# Patient Record
Sex: Female | Born: 1935
Health system: Southern US, Community
[De-identification: ages and names within clinical notes are randomized; demographics above are authoritative.]

## PROBLEM LIST (undated history)

## (undated) DIAGNOSIS — E079 Disorder of thyroid, unspecified: Secondary | ICD-10-CM

## (undated) DIAGNOSIS — D649 Anemia, unspecified: Secondary | ICD-10-CM

## (undated) DIAGNOSIS — I10 Essential (primary) hypertension: Secondary | ICD-10-CM

## (undated) HISTORY — PX: BACK SURGERY: SHX140

## (undated) HISTORY — PX: CHOLECYSTECTOMY: SHX55

---

## 2002-03-27 ENCOUNTER — Encounter: Payer: Self-pay | Admitting: Family Medicine

## 2002-03-27 ENCOUNTER — Ambulatory Visit (HOSPITAL_COMMUNITY): Admission: RE | Admit: 2002-03-27 | Discharge: 2002-03-27 | Payer: Self-pay | Admitting: Family Medicine

## 2008-08-28 ENCOUNTER — Ambulatory Visit (HOSPITAL_COMMUNITY): Admission: RE | Admit: 2008-08-28 | Discharge: 2008-08-28 | Payer: Self-pay | Admitting: Family Medicine

## 2009-12-26 ENCOUNTER — Ambulatory Visit (HOSPITAL_COMMUNITY): Admission: RE | Admit: 2009-12-26 | Discharge: 2009-12-26 | Payer: Self-pay | Admitting: Nephrology

## 2012-01-19 DIAGNOSIS — E119 Type 2 diabetes mellitus without complications: Secondary | ICD-10-CM | POA: Diagnosis not present

## 2012-01-19 DIAGNOSIS — H26019 Infantile and juvenile cortical, lamellar, or zonular cataract, unspecified eye: Secondary | ICD-10-CM | POA: Diagnosis not present

## 2012-01-19 DIAGNOSIS — H2510 Age-related nuclear cataract, unspecified eye: Secondary | ICD-10-CM | POA: Diagnosis not present

## 2012-02-08 DIAGNOSIS — D649 Anemia, unspecified: Secondary | ICD-10-CM | POA: Diagnosis not present

## 2012-02-08 DIAGNOSIS — E559 Vitamin D deficiency, unspecified: Secondary | ICD-10-CM | POA: Diagnosis not present

## 2012-02-08 DIAGNOSIS — R809 Proteinuria, unspecified: Secondary | ICD-10-CM | POA: Diagnosis not present

## 2012-02-08 DIAGNOSIS — M109 Gout, unspecified: Secondary | ICD-10-CM | POA: Diagnosis not present

## 2012-02-08 DIAGNOSIS — N189 Chronic kidney disease, unspecified: Secondary | ICD-10-CM | POA: Diagnosis not present

## 2012-02-08 DIAGNOSIS — Z79899 Other long term (current) drug therapy: Secondary | ICD-10-CM | POA: Diagnosis not present

## 2012-02-08 DIAGNOSIS — I10 Essential (primary) hypertension: Secondary | ICD-10-CM | POA: Diagnosis not present

## 2012-02-10 DIAGNOSIS — I10 Essential (primary) hypertension: Secondary | ICD-10-CM | POA: Diagnosis not present

## 2012-02-10 DIAGNOSIS — E559 Vitamin D deficiency, unspecified: Secondary | ICD-10-CM | POA: Diagnosis not present

## 2012-02-10 DIAGNOSIS — D649 Anemia, unspecified: Secondary | ICD-10-CM | POA: Diagnosis not present

## 2012-04-20 ENCOUNTER — Other Ambulatory Visit (HOSPITAL_COMMUNITY): Payer: Self-pay | Admitting: Family Medicine

## 2012-04-20 DIAGNOSIS — I10 Essential (primary) hypertension: Secondary | ICD-10-CM | POA: Diagnosis not present

## 2012-04-20 DIAGNOSIS — Z01419 Encounter for gynecological examination (general) (routine) without abnormal findings: Secondary | ICD-10-CM | POA: Diagnosis not present

## 2012-04-20 DIAGNOSIS — Z139 Encounter for screening, unspecified: Secondary | ICD-10-CM

## 2012-04-20 DIAGNOSIS — Z6825 Body mass index (BMI) 25.0-25.9, adult: Secondary | ICD-10-CM | POA: Diagnosis not present

## 2012-04-21 DIAGNOSIS — I10 Essential (primary) hypertension: Secondary | ICD-10-CM | POA: Diagnosis not present

## 2012-04-28 ENCOUNTER — Ambulatory Visit (HOSPITAL_COMMUNITY)
Admission: RE | Admit: 2012-04-28 | Discharge: 2012-04-28 | Disposition: A | Discharge status: Home / Self Care | Payer: Medicare Other | Source: Ambulatory Visit | Attending: Family Medicine | Admitting: Family Medicine

## 2012-04-28 DIAGNOSIS — Z13820 Encounter for screening for osteoporosis: Secondary | ICD-10-CM | POA: Insufficient documentation

## 2012-04-28 DIAGNOSIS — Z1231 Encounter for screening mammogram for malignant neoplasm of breast: Secondary | ICD-10-CM | POA: Diagnosis not present

## 2012-04-28 DIAGNOSIS — M949 Disorder of cartilage, unspecified: Secondary | ICD-10-CM | POA: Diagnosis not present

## 2012-04-28 DIAGNOSIS — Z139 Encounter for screening, unspecified: Secondary | ICD-10-CM

## 2012-04-28 DIAGNOSIS — M899 Disorder of bone, unspecified: Secondary | ICD-10-CM | POA: Diagnosis not present

## 2012-04-28 DIAGNOSIS — Z780 Asymptomatic menopausal state: Secondary | ICD-10-CM | POA: Insufficient documentation

## 2012-04-29 ENCOUNTER — Encounter (INDEPENDENT_AMBULATORY_CARE_PROVIDER_SITE_OTHER): Payer: Self-pay | Admitting: *Deleted

## 2012-08-04 DIAGNOSIS — E559 Vitamin D deficiency, unspecified: Secondary | ICD-10-CM | POA: Diagnosis not present

## 2012-08-04 DIAGNOSIS — D649 Anemia, unspecified: Secondary | ICD-10-CM | POA: Diagnosis not present

## 2012-08-04 DIAGNOSIS — Z79899 Other long term (current) drug therapy: Secondary | ICD-10-CM | POA: Diagnosis not present

## 2012-08-04 DIAGNOSIS — I10 Essential (primary) hypertension: Secondary | ICD-10-CM | POA: Diagnosis not present

## 2012-08-11 DIAGNOSIS — IMO0002 Reserved for concepts with insufficient information to code with codable children: Secondary | ICD-10-CM | POA: Diagnosis not present

## 2012-08-11 DIAGNOSIS — I10 Essential (primary) hypertension: Secondary | ICD-10-CM | POA: Diagnosis not present

## 2012-08-11 DIAGNOSIS — E785 Hyperlipidemia, unspecified: Secondary | ICD-10-CM | POA: Diagnosis not present

## 2012-08-11 DIAGNOSIS — E1129 Type 2 diabetes mellitus with other diabetic kidney complication: Secondary | ICD-10-CM | POA: Diagnosis not present

## 2012-08-30 DIAGNOSIS — D649 Anemia, unspecified: Secondary | ICD-10-CM | POA: Diagnosis not present

## 2012-08-30 DIAGNOSIS — I10 Essential (primary) hypertension: Secondary | ICD-10-CM | POA: Diagnosis not present

## 2013-01-24 DIAGNOSIS — E119 Type 2 diabetes mellitus without complications: Secondary | ICD-10-CM | POA: Diagnosis not present

## 2013-01-24 DIAGNOSIS — H2510 Age-related nuclear cataract, unspecified eye: Secondary | ICD-10-CM | POA: Diagnosis not present

## 2013-03-30 DIAGNOSIS — I10 Essential (primary) hypertension: Secondary | ICD-10-CM | POA: Diagnosis not present

## 2013-03-30 DIAGNOSIS — IMO0002 Reserved for concepts with insufficient information to code with codable children: Secondary | ICD-10-CM | POA: Diagnosis not present

## 2013-03-30 DIAGNOSIS — E1129 Type 2 diabetes mellitus with other diabetic kidney complication: Secondary | ICD-10-CM | POA: Diagnosis not present

## 2013-03-30 DIAGNOSIS — E785 Hyperlipidemia, unspecified: Secondary | ICD-10-CM | POA: Diagnosis not present

## 2013-04-13 DIAGNOSIS — Z79899 Other long term (current) drug therapy: Secondary | ICD-10-CM | POA: Diagnosis not present

## 2013-04-13 DIAGNOSIS — N189 Chronic kidney disease, unspecified: Secondary | ICD-10-CM | POA: Diagnosis not present

## 2013-04-13 DIAGNOSIS — E559 Vitamin D deficiency, unspecified: Secondary | ICD-10-CM | POA: Diagnosis not present

## 2013-04-13 DIAGNOSIS — D649 Anemia, unspecified: Secondary | ICD-10-CM | POA: Diagnosis not present

## 2013-04-17 DIAGNOSIS — D649 Anemia, unspecified: Secondary | ICD-10-CM | POA: Diagnosis not present

## 2013-04-17 DIAGNOSIS — M109 Gout, unspecified: Secondary | ICD-10-CM | POA: Diagnosis not present

## 2013-04-17 DIAGNOSIS — E559 Vitamin D deficiency, unspecified: Secondary | ICD-10-CM | POA: Diagnosis not present

## 2013-08-23 DIAGNOSIS — D649 Anemia, unspecified: Secondary | ICD-10-CM | POA: Diagnosis not present

## 2013-08-23 DIAGNOSIS — R809 Proteinuria, unspecified: Secondary | ICD-10-CM | POA: Diagnosis not present

## 2013-08-23 DIAGNOSIS — Z79899 Other long term (current) drug therapy: Secondary | ICD-10-CM | POA: Diagnosis not present

## 2013-08-23 DIAGNOSIS — N189 Chronic kidney disease, unspecified: Secondary | ICD-10-CM | POA: Diagnosis not present

## 2013-08-29 DIAGNOSIS — M109 Gout, unspecified: Secondary | ICD-10-CM | POA: Diagnosis not present

## 2013-08-29 DIAGNOSIS — I10 Essential (primary) hypertension: Secondary | ICD-10-CM | POA: Diagnosis not present

## 2013-08-29 DIAGNOSIS — E559 Vitamin D deficiency, unspecified: Secondary | ICD-10-CM | POA: Diagnosis not present

## 2013-09-29 DIAGNOSIS — IMO0002 Reserved for concepts with insufficient information to code with codable children: Secondary | ICD-10-CM | POA: Diagnosis not present

## 2013-09-29 DIAGNOSIS — I10 Essential (primary) hypertension: Secondary | ICD-10-CM | POA: Diagnosis not present

## 2013-09-29 DIAGNOSIS — E039 Hypothyroidism, unspecified: Secondary | ICD-10-CM | POA: Diagnosis not present

## 2014-01-11 DIAGNOSIS — R809 Proteinuria, unspecified: Secondary | ICD-10-CM | POA: Diagnosis not present

## 2014-01-11 DIAGNOSIS — N189 Chronic kidney disease, unspecified: Secondary | ICD-10-CM | POA: Diagnosis not present

## 2014-01-11 DIAGNOSIS — Z79899 Other long term (current) drug therapy: Secondary | ICD-10-CM | POA: Diagnosis not present

## 2014-01-11 DIAGNOSIS — D649 Anemia, unspecified: Secondary | ICD-10-CM | POA: Diagnosis not present

## 2014-01-11 DIAGNOSIS — E559 Vitamin D deficiency, unspecified: Secondary | ICD-10-CM | POA: Diagnosis not present

## 2014-01-16 DIAGNOSIS — I10 Essential (primary) hypertension: Secondary | ICD-10-CM | POA: Diagnosis not present

## 2014-01-16 DIAGNOSIS — D649 Anemia, unspecified: Secondary | ICD-10-CM | POA: Diagnosis not present

## 2014-01-16 DIAGNOSIS — E559 Vitamin D deficiency, unspecified: Secondary | ICD-10-CM | POA: Diagnosis not present

## 2014-01-16 DIAGNOSIS — N183 Chronic kidney disease, stage 3 unspecified: Secondary | ICD-10-CM | POA: Diagnosis not present

## 2014-02-27 DIAGNOSIS — H2510 Age-related nuclear cataract, unspecified eye: Secondary | ICD-10-CM | POA: Diagnosis not present

## 2014-02-27 DIAGNOSIS — E119 Type 2 diabetes mellitus without complications: Secondary | ICD-10-CM | POA: Diagnosis not present

## 2014-02-27 DIAGNOSIS — H26019 Infantile and juvenile cortical, lamellar, or zonular cataract, unspecified eye: Secondary | ICD-10-CM | POA: Diagnosis not present

## 2014-04-23 DIAGNOSIS — N189 Chronic kidney disease, unspecified: Secondary | ICD-10-CM | POA: Diagnosis not present

## 2014-04-23 DIAGNOSIS — R809 Proteinuria, unspecified: Secondary | ICD-10-CM | POA: Diagnosis not present

## 2014-04-23 DIAGNOSIS — E559 Vitamin D deficiency, unspecified: Secondary | ICD-10-CM | POA: Diagnosis not present

## 2014-04-23 DIAGNOSIS — Z79899 Other long term (current) drug therapy: Secondary | ICD-10-CM | POA: Diagnosis not present

## 2014-04-23 DIAGNOSIS — D649 Anemia, unspecified: Secondary | ICD-10-CM | POA: Diagnosis not present

## 2014-04-25 DIAGNOSIS — N183 Chronic kidney disease, stage 3 unspecified: Secondary | ICD-10-CM | POA: Diagnosis not present

## 2014-04-25 DIAGNOSIS — D649 Anemia, unspecified: Secondary | ICD-10-CM | POA: Diagnosis not present

## 2014-04-25 DIAGNOSIS — E559 Vitamin D deficiency, unspecified: Secondary | ICD-10-CM | POA: Diagnosis not present

## 2014-04-25 DIAGNOSIS — I10 Essential (primary) hypertension: Secondary | ICD-10-CM | POA: Diagnosis not present

## 2014-04-29 ENCOUNTER — Emergency Department (HOSPITAL_COMMUNITY): Payer: Medicare Other

## 2014-04-29 ENCOUNTER — Encounter (HOSPITAL_COMMUNITY): Payer: Self-pay | Admitting: Emergency Medicine

## 2014-04-29 ENCOUNTER — Emergency Department (HOSPITAL_COMMUNITY)
Admission: EM | Admit: 2014-04-29 | Discharge: 2014-04-29 | Disposition: A | Discharge status: Home / Self Care | Payer: Medicare Other | Attending: Emergency Medicine | Admitting: Emergency Medicine

## 2014-04-29 DIAGNOSIS — Y9389 Activity, other specified: Secondary | ICD-10-CM | POA: Insufficient documentation

## 2014-04-29 DIAGNOSIS — M5137 Other intervertebral disc degeneration, lumbosacral region: Secondary | ICD-10-CM | POA: Diagnosis not present

## 2014-04-29 DIAGNOSIS — IMO0002 Reserved for concepts with insufficient information to code with codable children: Secondary | ICD-10-CM | POA: Diagnosis not present

## 2014-04-29 DIAGNOSIS — K5900 Constipation, unspecified: Secondary | ICD-10-CM | POA: Insufficient documentation

## 2014-04-29 DIAGNOSIS — M549 Dorsalgia, unspecified: Secondary | ICD-10-CM | POA: Diagnosis not present

## 2014-04-29 DIAGNOSIS — M47817 Spondylosis without myelopathy or radiculopathy, lumbosacral region: Secondary | ICD-10-CM | POA: Diagnosis not present

## 2014-04-29 DIAGNOSIS — I10 Essential (primary) hypertension: Secondary | ICD-10-CM | POA: Diagnosis not present

## 2014-04-29 DIAGNOSIS — Z8639 Personal history of other endocrine, nutritional and metabolic disease: Secondary | ICD-10-CM | POA: Insufficient documentation

## 2014-04-29 DIAGNOSIS — X500XXA Overexertion from strenuous movement or load, initial encounter: Secondary | ICD-10-CM | POA: Insufficient documentation

## 2014-04-29 DIAGNOSIS — Y9289 Other specified places as the place of occurrence of the external cause: Secondary | ICD-10-CM | POA: Insufficient documentation

## 2014-04-29 DIAGNOSIS — Z862 Personal history of diseases of the blood and blood-forming organs and certain disorders involving the immune mechanism: Secondary | ICD-10-CM | POA: Diagnosis not present

## 2014-04-29 HISTORY — DX: Anemia, unspecified: D64.9

## 2014-04-29 HISTORY — DX: Essential (primary) hypertension: I10

## 2014-04-29 HISTORY — DX: Disorder of thyroid, unspecified: E07.9

## 2014-04-29 LAB — URINALYSIS, ROUTINE W REFLEX MICROSCOPIC
Bilirubin Urine: NEGATIVE
Glucose, UA: NEGATIVE mg/dL
HGB URINE DIPSTICK: NEGATIVE
KETONES UR: NEGATIVE mg/dL
Leukocytes, UA: NEGATIVE
Nitrite: NEGATIVE
PROTEIN: NEGATIVE mg/dL
Specific Gravity, Urine: 1.02 (ref 1.005–1.030)
UROBILINOGEN UA: 1 mg/dL (ref 0.0–1.0)
pH: 6 (ref 5.0–8.0)

## 2014-04-29 MED ORDER — IBUPROFEN 400 MG PO TABS: 400.0000 mg | ORAL_TABLET | Freq: Three times a day (TID) | ORAL | Status: AC

## 2014-04-29 MED ORDER — ACETAMINOPHEN 325 MG PO TABS
650.0000 mg | ORAL_TABLET | Freq: Once | ORAL | Status: AC
  Administered 2014-04-29: 650 mg via ORAL
  Filled 2014-04-29: qty 2

## 2014-04-29 MED ORDER — LUMBAR BACK BRACE/SUPPORT PAD MISC: 1.0000 | Freq: Once | Status: DC

## 2014-04-29 NOTE — Discharge Instructions (Signed)
As discussed, your evaluation today has been largely reassuring.  But, it is important that you monitor your condition carefully, and do not hesitate to return to the ED if you develop new, or concerning changes in your condition.  Your pain is likely to due strain of the muscles in your lower back.  Please follow-up with your physician for appropriate ongoing care.   Back Pain, Adult Back pain is very common. The pain often gets better over time. The cause of back pain is usually not dangerous. Most people can learn to manage their back pain on their own.  HOME CARE   Stay active. Start with short walks on flat ground if you can. Try to walk farther each day.  Do not sit, drive, or stand in one place for more than 30 minutes. Do not stay in bed.  Do not avoid exercise or work. Activity can help your back heal faster.  Be careful when you bend or lift an object. Bend at your knees, keep the object close to you, and do not twist.  Sleep on a firm mattress. Lie on your side, and bend your knees. If you lie on your back, put a pillow under your knees.  Only take medicines as told by your doctor.  Put ice on the injured area.  Put ice in a plastic bag.  Place a towel between your skin and the bag.  Leave the ice on for 15-20 minutes, 03-04 times a day for the first 2 to 3 days. After that, you can switch between ice and heat packs.  Ask your doctor about back exercises or massage.  Avoid feeling anxious or stressed. Find good ways to deal with stress, such as exercise. GET HELP RIGHT AWAY IF:   Your pain does not go away with rest or medicine.  Your pain does not go away in 1 week.  You have new problems.  You do not feel well.  The pain spreads into your legs.  You cannot control when you poop (bowel movement) or pee (urinate).  Your arms or legs feel weak or lose feeling (numbness).  You feel sick to your stomach (nauseous) or throw up (vomit).  You have belly  (abdominal) pain.  You feel like you may pass out (faint). MAKE SURE YOU:   Understand these instructions.  Will watch your condition.  Will get help right away if you are not doing well or get worse. Document Released: 05/25/2008 Document Revised: 02/29/2012 Document Reviewed: 04/27/2011 [facility] Patient Information 2014 [employer].

## 2014-04-29 NOTE — ED Provider Notes (Signed)
CSN: [id number]     Arrival date & time 04/29/14  0836 History  This chart was scribed for Carmin Muskrat, MD by Zettie Pho, ED Scribe. This patient was seen in room APA05/APA05 and the patient's care was started at 8:53 AM.    Chief Complaint  Patient presents with  . Back Pain   The history is provided by the patient. No language interpreter was used.   HPI Comments: Brandi Blackburn is a 80 y.o. female who presents to the Emergency Department complaining of a constant pain to the lower back onset 5 days ago after lifting water cans and repeatedly bending over while doing yard work. She states that the pain has been progressively worsening. Patient reports applying heat to the area without significant relief and taking Tylenol at home with mild, temporary relief. She reports that she has not had  BM in 5 days, which is unusual for her. Patient denies history of back problems or surgeries. She denies abdominal pain, rash, fever, vomiting, urinary symptoms. Patient has a history of thyroid disease, HTN, and anemia.   No past medical history on file. No past surgical history on file. No family history on file. History  Substance Use Topics  . Smoking status: Not on file  . Smokeless tobacco: Not on file  . Alcohol Use: Not on file   OB History   No data available     Review of Systems  Constitutional: Negative for fever.       Per HPI, otherwise negative  HENT:       Per HPI, otherwise negative  Respiratory:       Per HPI, otherwise negative  Cardiovascular:       Per HPI, otherwise negative  Gastrointestinal: Positive for constipation. Negative for vomiting and abdominal pain.  Endocrine:       Negative aside from HPI  Genitourinary: Negative for dysuria, frequency, hematuria and difficulty urinating.       Neg aside from HPI   Musculoskeletal: Positive for back pain.       Per HPI, otherwise negative  Skin: Negative.  Negative for rash.  Neurological: Negative for  syncope.      Allergies  Review of patient's allergies indicates not on file.  Home Medications   Prior to Admission medications   Not on File   Triage Vitals: BP 147/62  Pulse 80  Temp(Src) 99 F (37.2 C) (Oral)  Resp 18  Ht 5\' 3"  (1.6 m)  Wt 122 lb (55.339 kg)  BMI 21.62 kg/m2  SpO2 98%  Physical Exam  Nursing note and vitals reviewed. Constitutional: She is oriented to person, place, and time. She appears well-developed and well-nourished. No distress.  HENT:  Head: Normocephalic and atraumatic.  Eyes: Conjunctivae and EOM are normal.  Cardiovascular: Normal rate and regular rhythm.   Pulmonary/Chest: Effort normal and breath sounds normal. No stridor. No respiratory distress.  Abdominal: Soft. Bowel sounds are normal. She exhibits no distension. There is no tenderness. There is no rebound and no guarding.  Musculoskeletal: She exhibits no edema.  No deformities or tenderness to palpation to the spine.   Neurological: She is alert and oriented to person, place, and time. No cranial nerve deficit.  Skin: Skin is warm and dry.  Psychiatric: She has a normal mood and affect.    ED Course  Procedures (including critical care time)  DIAGNOSTIC STUDIES: Oxygen Saturation is 98% on room air, normal by my interpretation.    COORDINATION OF CARE:  8:59 AM- Will order an x-ray of the L spine and UA. Ordered Tylenol to manage symptoms. Discussed treatment plan with patient at bedside and patient verbalized agreement.     Labs Review Labs Reviewed  URINALYSIS, ROUTINE W REFLEX MICROSCOPIC    Imaging Review Results reviewed in EMR - and images interpreted by me - no acute pathology    Update: Patient has been ambulatory, appears in no distress.  MDM    I personally performed the services described in this documentation, which was scribed in my presence. The recorded information has been reviewed and is accurate.   This patient presents with back pain.  Patient  notes that the pain began after working in her garden.  Patient is neurologically intact and hemodynamically stable, urinalysis does not suggest renal pathology, and x-ray does not demonstrate new acute bone lesions.  Patient was discharged with a course of cryotherapy, ibuprofen, to followup with orthopedics.    Carmin Muskrat, MD 04/29/14 [phone]

## 2014-04-29 NOTE — ED Notes (Signed)
Pt states she was lifting water cans Wednesday to water her flowers. Complain of pain in lower back now

## 2014-05-04 ENCOUNTER — Other Ambulatory Visit (HOSPITAL_COMMUNITY): Payer: Self-pay | Admitting: Family Medicine

## 2014-05-04 DIAGNOSIS — M5430 Sciatica, unspecified side: Secondary | ICD-10-CM | POA: Diagnosis not present

## 2014-05-04 DIAGNOSIS — M810 Age-related osteoporosis without current pathological fracture: Secondary | ICD-10-CM

## 2014-05-04 DIAGNOSIS — IMO0002 Reserved for concepts with insufficient information to code with codable children: Secondary | ICD-10-CM | POA: Diagnosis not present

## 2014-05-09 ENCOUNTER — Ambulatory Visit (HOSPITAL_COMMUNITY)
Admission: RE | Admit: 2014-05-09 | Discharge: 2014-05-09 | Disposition: A | Discharge status: Home / Self Care | Payer: Medicare Other | Source: Ambulatory Visit | Attending: Family Medicine | Admitting: Family Medicine

## 2014-05-09 DIAGNOSIS — Z13820 Encounter for screening for osteoporosis: Secondary | ICD-10-CM | POA: Diagnosis not present

## 2014-05-09 DIAGNOSIS — M810 Age-related osteoporosis without current pathological fracture: Secondary | ICD-10-CM | POA: Diagnosis not present

## 2014-08-23 DIAGNOSIS — Z79899 Other long term (current) drug therapy: Secondary | ICD-10-CM | POA: Diagnosis not present

## 2014-08-23 DIAGNOSIS — E559 Vitamin D deficiency, unspecified: Secondary | ICD-10-CM | POA: Diagnosis not present

## 2014-08-23 DIAGNOSIS — R809 Proteinuria, unspecified: Secondary | ICD-10-CM | POA: Diagnosis not present

## 2014-08-23 DIAGNOSIS — D649 Anemia, unspecified: Secondary | ICD-10-CM | POA: Diagnosis not present

## 2014-08-23 DIAGNOSIS — N189 Chronic kidney disease, unspecified: Secondary | ICD-10-CM | POA: Diagnosis not present

## 2014-08-29 DIAGNOSIS — I10 Essential (primary) hypertension: Secondary | ICD-10-CM | POA: Diagnosis not present

## 2014-08-29 DIAGNOSIS — D649 Anemia, unspecified: Secondary | ICD-10-CM | POA: Diagnosis not present

## 2014-08-29 DIAGNOSIS — N183 Chronic kidney disease, stage 3 unspecified: Secondary | ICD-10-CM | POA: Diagnosis not present

## 2014-12-10 DIAGNOSIS — I10 Essential (primary) hypertension: Secondary | ICD-10-CM | POA: Diagnosis not present

## 2014-12-10 DIAGNOSIS — Z6824 Body mass index (BMI) 24.0-24.9, adult: Secondary | ICD-10-CM | POA: Diagnosis not present

## 2014-12-10 DIAGNOSIS — E782 Mixed hyperlipidemia: Secondary | ICD-10-CM | POA: Diagnosis not present

## 2014-12-10 DIAGNOSIS — E039 Hypothyroidism, unspecified: Secondary | ICD-10-CM | POA: Diagnosis not present

## 2015-01-07 DIAGNOSIS — Z79899 Other long term (current) drug therapy: Secondary | ICD-10-CM | POA: Diagnosis not present

## 2015-01-07 DIAGNOSIS — I10 Essential (primary) hypertension: Secondary | ICD-10-CM | POA: Diagnosis not present

## 2015-01-07 DIAGNOSIS — R809 Proteinuria, unspecified: Secondary | ICD-10-CM | POA: Diagnosis not present

## 2015-01-07 DIAGNOSIS — E559 Vitamin D deficiency, unspecified: Secondary | ICD-10-CM | POA: Diagnosis not present

## 2015-01-07 DIAGNOSIS — N183 Chronic kidney disease, stage 3 (moderate): Secondary | ICD-10-CM | POA: Diagnosis not present

## 2015-01-07 DIAGNOSIS — D649 Anemia, unspecified: Secondary | ICD-10-CM | POA: Diagnosis not present

## 2015-01-09 DIAGNOSIS — N183 Chronic kidney disease, stage 3 (moderate): Secondary | ICD-10-CM | POA: Diagnosis not present

## 2015-01-09 DIAGNOSIS — D638 Anemia in other chronic diseases classified elsewhere: Secondary | ICD-10-CM | POA: Diagnosis not present

## 2015-01-09 DIAGNOSIS — I10 Essential (primary) hypertension: Secondary | ICD-10-CM | POA: Diagnosis not present

## 2015-01-09 DIAGNOSIS — N2581 Secondary hyperparathyroidism of renal origin: Secondary | ICD-10-CM | POA: Diagnosis not present

## 2015-03-26 DIAGNOSIS — H25013 Cortical age-related cataract, bilateral: Secondary | ICD-10-CM | POA: Diagnosis not present

## 2015-03-26 DIAGNOSIS — E119 Type 2 diabetes mellitus without complications: Secondary | ICD-10-CM | POA: Diagnosis not present

## 2015-03-26 DIAGNOSIS — H2513 Age-related nuclear cataract, bilateral: Secondary | ICD-10-CM | POA: Diagnosis not present

## 2015-04-25 DIAGNOSIS — I10 Essential (primary) hypertension: Secondary | ICD-10-CM | POA: Diagnosis not present

## 2015-04-25 DIAGNOSIS — N183 Chronic kidney disease, stage 3 (moderate): Secondary | ICD-10-CM | POA: Diagnosis not present

## 2015-04-25 DIAGNOSIS — Z79899 Other long term (current) drug therapy: Secondary | ICD-10-CM | POA: Diagnosis not present

## 2015-04-25 DIAGNOSIS — D649 Anemia, unspecified: Secondary | ICD-10-CM | POA: Diagnosis not present

## 2015-04-25 DIAGNOSIS — E559 Vitamin D deficiency, unspecified: Secondary | ICD-10-CM | POA: Diagnosis not present

## 2015-04-25 DIAGNOSIS — R809 Proteinuria, unspecified: Secondary | ICD-10-CM | POA: Diagnosis not present

## 2015-06-13 DIAGNOSIS — Z6822 Body mass index (BMI) 22.0-22.9, adult: Secondary | ICD-10-CM | POA: Diagnosis not present

## 2015-06-13 DIAGNOSIS — Z1389 Encounter for screening for other disorder: Secondary | ICD-10-CM | POA: Diagnosis not present

## 2015-06-13 DIAGNOSIS — S29011A Strain of muscle and tendon of front wall of thorax, initial encounter: Secondary | ICD-10-CM | POA: Diagnosis not present

## 2015-06-13 DIAGNOSIS — I10 Essential (primary) hypertension: Secondary | ICD-10-CM | POA: Diagnosis not present

## 2015-06-13 DIAGNOSIS — E039 Hypothyroidism, unspecified: Secondary | ICD-10-CM | POA: Diagnosis not present

## 2015-06-13 DIAGNOSIS — N184 Chronic kidney disease, stage 4 (severe): Secondary | ICD-10-CM | POA: Diagnosis not present

## 2015-06-13 DIAGNOSIS — R7309 Other abnormal glucose: Secondary | ICD-10-CM | POA: Diagnosis not present

## 2015-06-13 DIAGNOSIS — E782 Mixed hyperlipidemia: Secondary | ICD-10-CM | POA: Diagnosis not present

## 2015-08-15 DIAGNOSIS — D509 Iron deficiency anemia, unspecified: Secondary | ICD-10-CM | POA: Diagnosis not present

## 2015-08-15 DIAGNOSIS — N183 Chronic kidney disease, stage 3 (moderate): Secondary | ICD-10-CM | POA: Diagnosis not present

## 2015-08-15 DIAGNOSIS — R809 Proteinuria, unspecified: Secondary | ICD-10-CM | POA: Diagnosis not present

## 2015-08-15 DIAGNOSIS — D649 Anemia, unspecified: Secondary | ICD-10-CM | POA: Diagnosis not present

## 2015-08-15 DIAGNOSIS — Z79899 Other long term (current) drug therapy: Secondary | ICD-10-CM | POA: Diagnosis not present

## 2015-08-15 DIAGNOSIS — I10 Essential (primary) hypertension: Secondary | ICD-10-CM | POA: Diagnosis not present

## 2015-08-15 DIAGNOSIS — E559 Vitamin D deficiency, unspecified: Secondary | ICD-10-CM | POA: Diagnosis not present

## 2015-08-28 DIAGNOSIS — N183 Chronic kidney disease, stage 3 (moderate): Secondary | ICD-10-CM | POA: Diagnosis not present

## 2015-08-28 DIAGNOSIS — D638 Anemia in other chronic diseases classified elsewhere: Secondary | ICD-10-CM | POA: Diagnosis not present

## 2015-08-28 DIAGNOSIS — E559 Vitamin D deficiency, unspecified: Secondary | ICD-10-CM | POA: Diagnosis not present

## 2015-08-28 DIAGNOSIS — I10 Essential (primary) hypertension: Secondary | ICD-10-CM | POA: Diagnosis not present

## 2015-09-09 IMAGING — CR DG LUMBAR SPINE COMPLETE 4+V
5 series · 5 of 5 positions shown · non-contrast
Comparison: None.

CLINICAL DATA: Back pain, no injury

EXAM:
LUMBAR SPINE - COMPLETE 4+ VIEW

[view not recorded (1 of 5)]
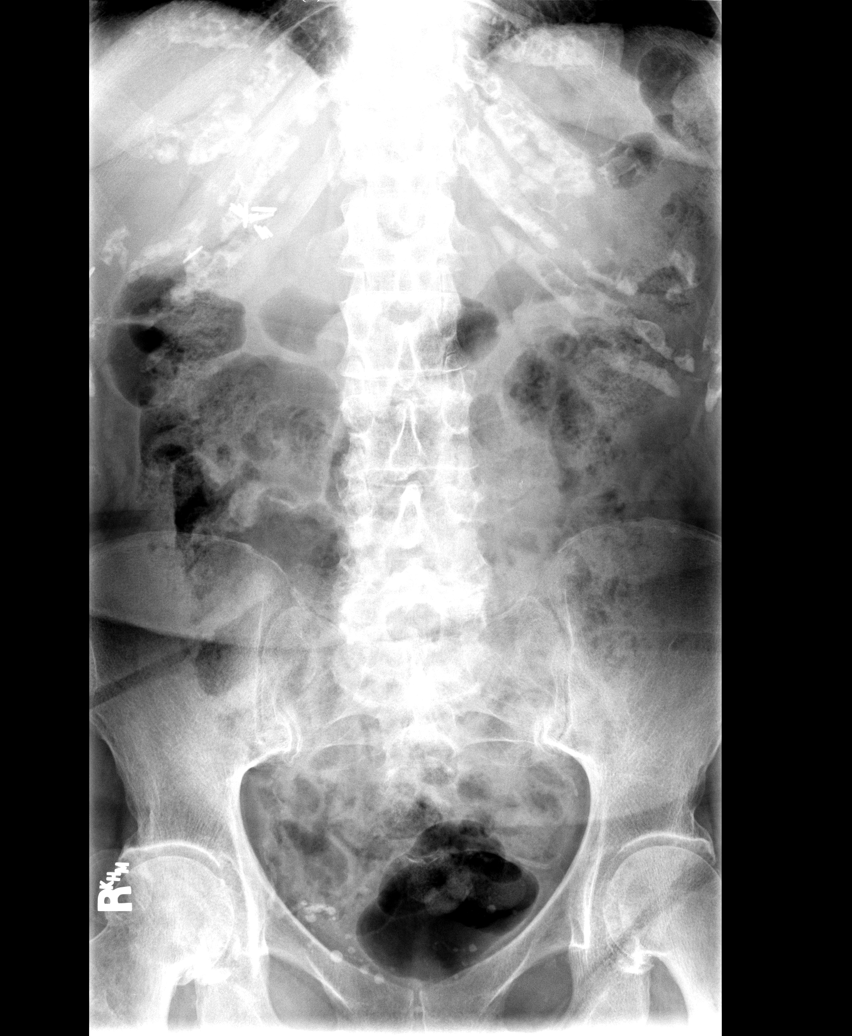

[view not recorded (2 of 5)]
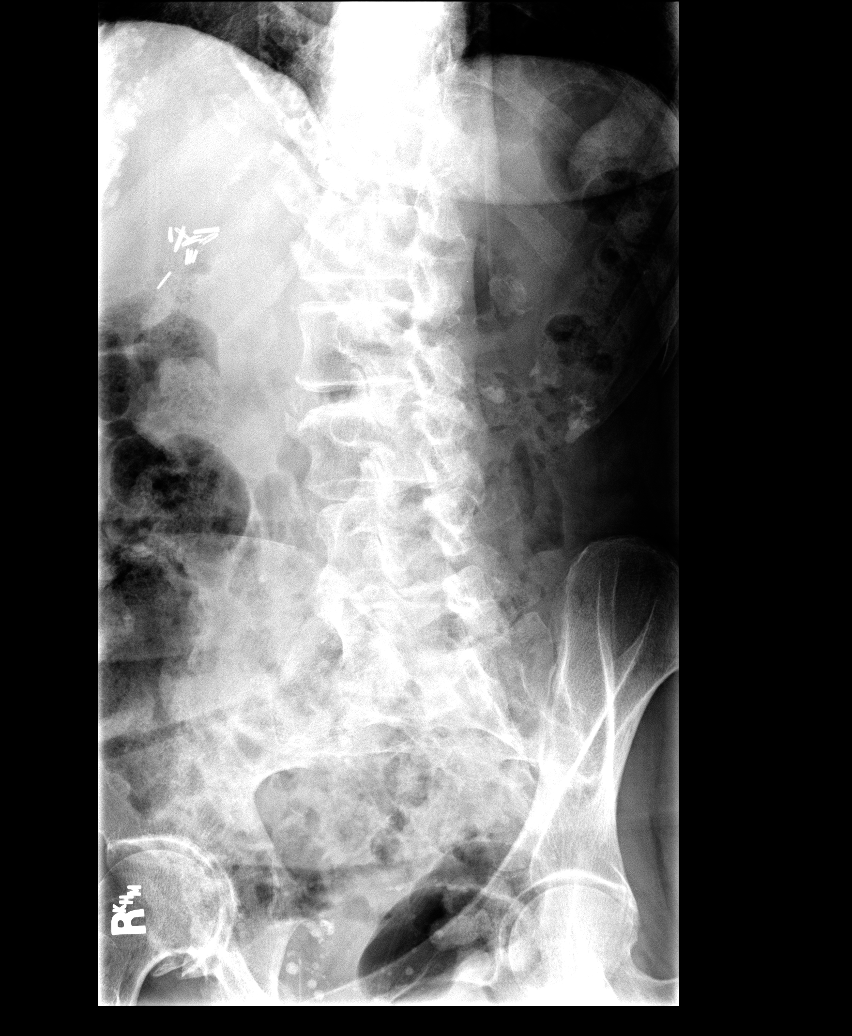

[view not recorded (3 of 5)]
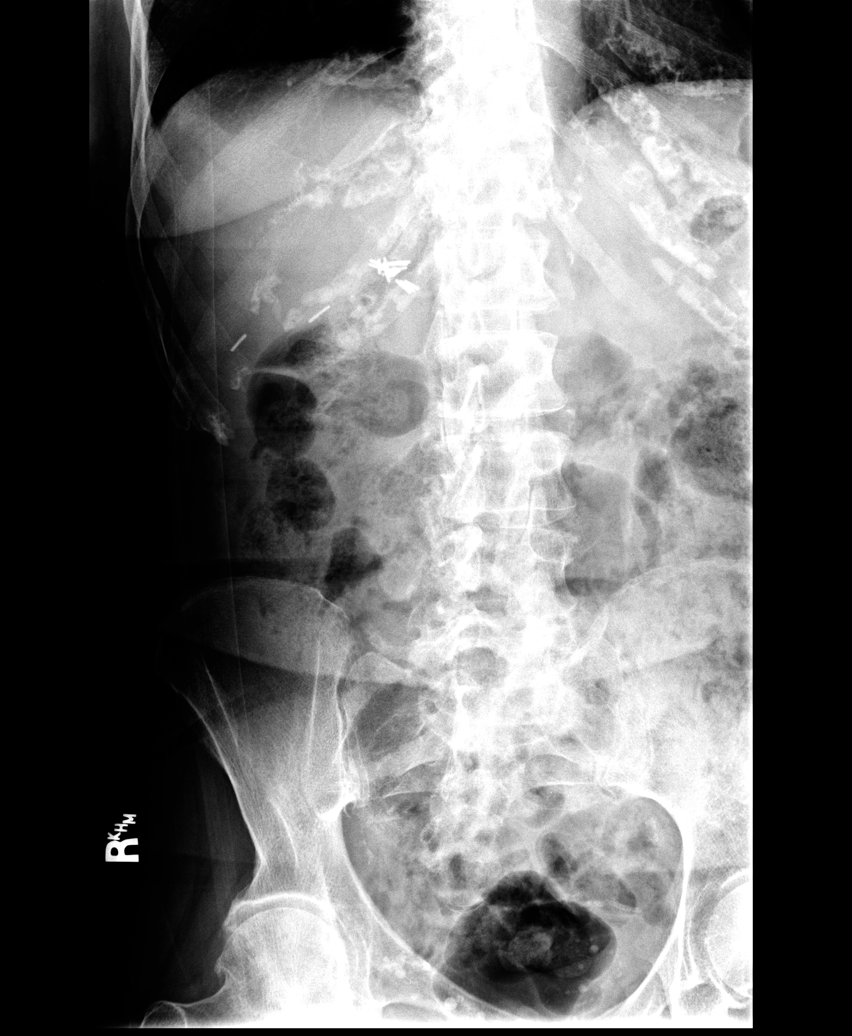

[view not recorded (4 of 5)]
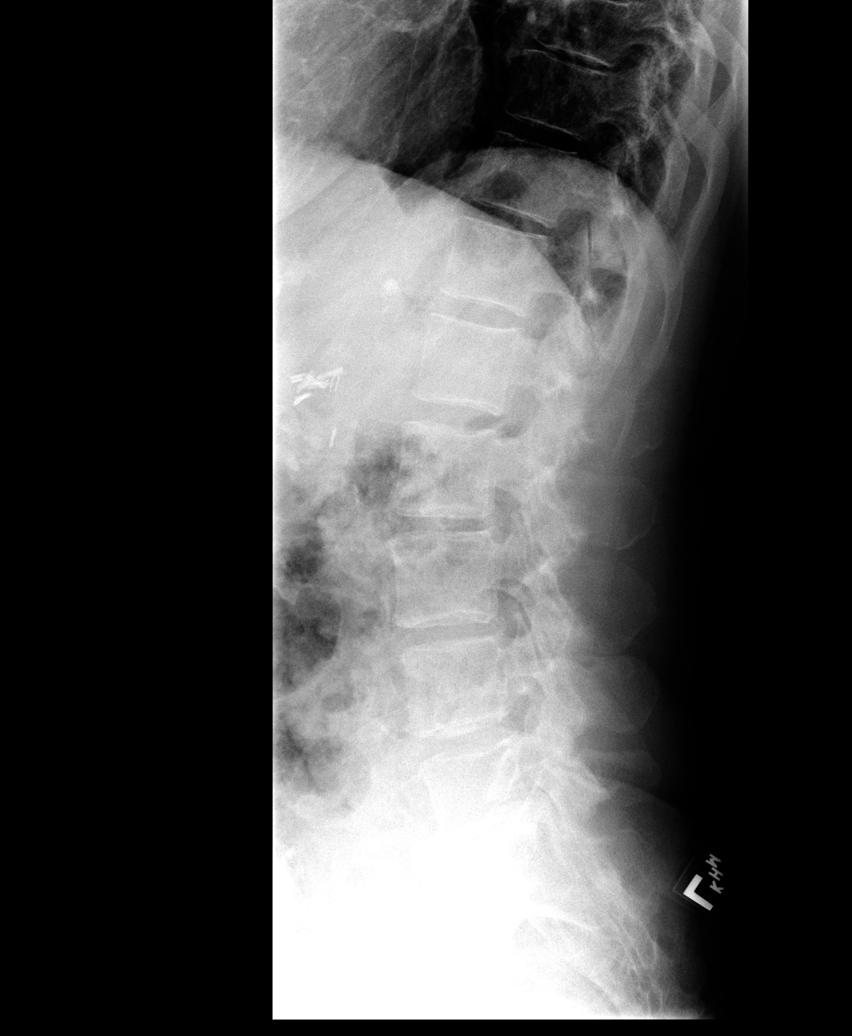

[view not recorded (5 of 5)]
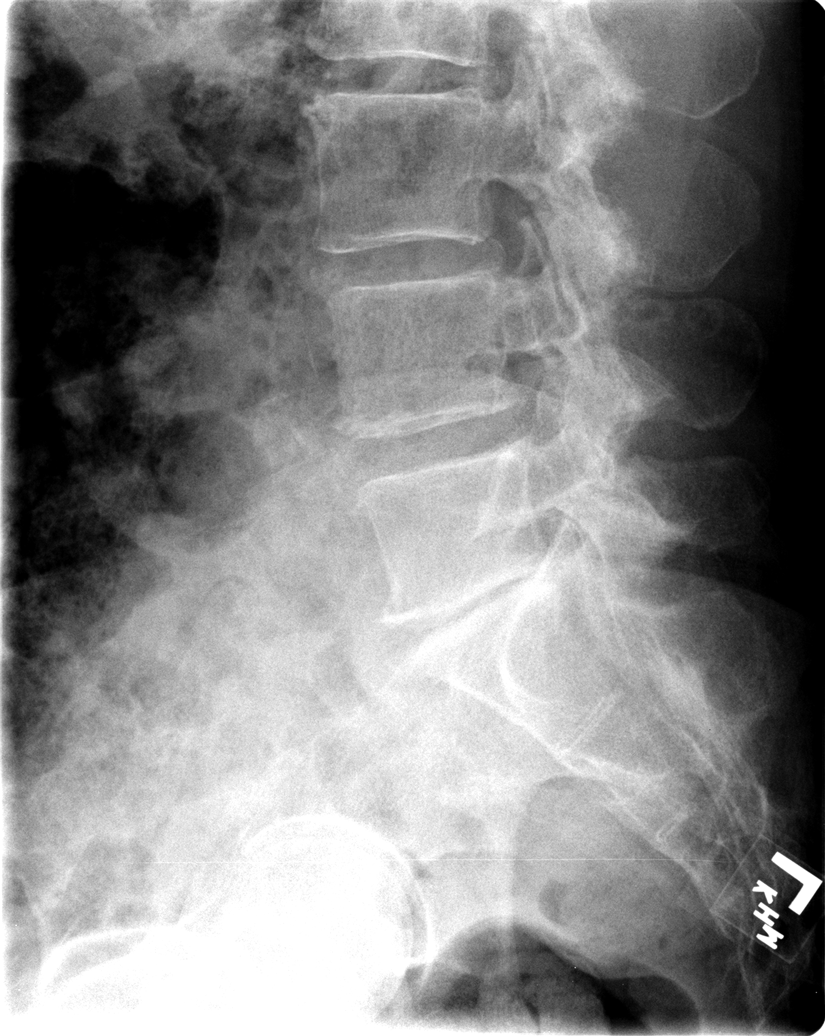

[5 of 5 positions shown; findings below may reference images not displayed]

FINDINGS: Normal alignment with no fracture. Mild L4-5 and moderate L5-S1
degenerative disc disease. Mild facet arthropathy at L4-5 and L5-S1
as well. There is no evidence of acute osseous abnormality.
IMPRESSION: No acute findings.  Degenerative changes.

## 2015-09-16 ENCOUNTER — Encounter (HOSPITAL_COMMUNITY)
Admission: RE | Admit: 2015-09-16 | Discharge: 2015-09-16 | Disposition: A | Discharge status: Home / Self Care | Payer: Medicare Other | Source: Ambulatory Visit | Attending: Nephrology | Admitting: Nephrology

## 2015-09-16 DIAGNOSIS — D509 Iron deficiency anemia, unspecified: Secondary | ICD-10-CM | POA: Insufficient documentation

## 2015-09-16 DIAGNOSIS — N183 Chronic kidney disease, stage 3 (moderate): Secondary | ICD-10-CM | POA: Diagnosis not present

## 2015-09-16 LAB — HEMOGLOBIN AND HEMATOCRIT, BLOOD
HCT: 30.7 % — ABNORMAL LOW (ref 36.0–46.0)
Hemoglobin: 10.2 g/dL — ABNORMAL LOW (ref 12.0–15.0)

## 2015-09-16 LAB — RENAL FUNCTION PANEL
ALBUMIN: 3.9 g/dL (ref 3.5–5.0)
Anion gap: 9 (ref 5–15)
BUN: 20 mg/dL (ref 6–20)
CALCIUM: 8.7 mg/dL — AB (ref 8.9–10.3)
CO2: 24 mmol/L (ref 22–32)
Chloride: 107 mmol/L (ref 101–111)
Creatinine, Ser: 1.36 mg/dL — ABNORMAL HIGH (ref 0.44–1.00)
GFR calc Af Amer: 39 mL/min — ABNORMAL LOW (ref >60)
GFR calc non Af Amer: 34 mL/min — ABNORMAL LOW (ref >60)
Glucose, Bld: 117 mg/dL — ABNORMAL HIGH (ref 65–99)
PHOSPHORUS: 3 mg/dL (ref 2.5–4.6)
POTASSIUM: 3.7 mmol/L (ref 3.5–5.1)
SODIUM: 140 mmol/L (ref 135–145)

## 2015-09-16 NOTE — Progress Notes (Signed)
Results for CHERL, BALLIET (MRN [id number]) as of 09/16/2015 13:00  Ref. Range 09/16/2015 09:00  Hemoglobin Latest Ref Range: 12.0-15.0 g/dL 10.2 (L)  HCT Latest Ref Range: 36.0-46.0 % 30.7 (L)

## 2015-09-16 NOTE — Progress Notes (Signed)
Hgb 10.2/Hct 30.7. Procrit not given.

## 2015-09-30 ENCOUNTER — Encounter (HOSPITAL_COMMUNITY): Payer: Self-pay

## 2015-09-30 ENCOUNTER — Encounter (HOSPITAL_COMMUNITY)
Admission: RE | Admit: 2015-09-30 | Discharge: 2015-09-30 | Disposition: A | Discharge status: Home / Self Care | Payer: Medicare Other | Source: Ambulatory Visit | Attending: Nephrology | Admitting: Nephrology

## 2015-09-30 DIAGNOSIS — N183 Chronic kidney disease, stage 3 (moderate): Secondary | ICD-10-CM | POA: Diagnosis not present

## 2015-09-30 DIAGNOSIS — D509 Iron deficiency anemia, unspecified: Secondary | ICD-10-CM | POA: Insufficient documentation

## 2015-09-30 LAB — HEMOGLOBIN AND HEMATOCRIT, BLOOD
HCT: 33.7 % — ABNORMAL LOW (ref 36.0–46.0)
Hemoglobin: 11.1 g/dL — ABNORMAL LOW (ref 12.0–15.0)

## 2015-09-30 NOTE — Progress Notes (Signed)
Results for MAKEESHA, REFFNER (MRN [id number]) as of 09/30/2015 10:53  Arrived today for labs and possible injection.  No procrit required today due to orders requirements. Next appointment 10/16/2015   Ref. Range 09/30/2015 10:04  Hemoglobin Latest Ref Range: 12.0-15.0 g/dL 11.1 (L)  HCT Latest Ref Range: 36.0-46.0 % 33.7 (L)

## 2015-10-16 ENCOUNTER — Encounter (HOSPITAL_COMMUNITY)
Admission: RE | Admit: 2015-10-16 | Discharge: 2015-10-16 | Disposition: A | Discharge status: Home / Self Care | Payer: Medicare Other | Source: Ambulatory Visit | Attending: Nephrology | Admitting: Nephrology

## 2015-10-16 DIAGNOSIS — D509 Iron deficiency anemia, unspecified: Secondary | ICD-10-CM | POA: Diagnosis not present

## 2015-10-16 DIAGNOSIS — N183 Chronic kidney disease, stage 3 (moderate): Secondary | ICD-10-CM | POA: Diagnosis not present

## 2015-10-16 LAB — HEMOGLOBIN AND HEMATOCRIT, BLOOD
HEMATOCRIT: 34.9 % — AB (ref 36.0–46.0)
Hemoglobin: 11.6 g/dL — ABNORMAL LOW (ref 12.0–15.0)

## 2015-10-16 LAB — RENAL FUNCTION PANEL
ALBUMIN: 4.5 g/dL (ref 3.5–5.0)
ANION GAP: 8 (ref 5–15)
BUN: 21 mg/dL — ABNORMAL HIGH (ref 6–20)
CALCIUM: 9.5 mg/dL (ref 8.9–10.3)
CO2: 27 mmol/L (ref 22–32)
Chloride: 104 mmol/L (ref 101–111)
Creatinine, Ser: 1.43 mg/dL — ABNORMAL HIGH (ref 0.44–1.00)
GFR calc non Af Amer: 32 mL/min — ABNORMAL LOW (ref >60)
GFR, EST AFRICAN AMERICAN: 37 mL/min — AB (ref >60)
Glucose, Bld: 120 mg/dL — ABNORMAL HIGH (ref 65–99)
PHOSPHORUS: 3.4 mg/dL (ref 2.5–4.6)
Potassium: 4.2 mmol/L (ref 3.5–5.1)
SODIUM: 139 mmol/L (ref 135–145)

## 2015-10-16 MED ORDER — EPOETIN ALFA 4000 UNIT/ML IJ SOLN: 4000.0000 [IU] | Freq: Once | INTRAMUSCULAR | Status: DC

## 2015-10-16 NOTE — Progress Notes (Signed)
Results for RHENDA, OREGON (MRN [id number]) as of 10/16/2015 11:54  Ref. Range 10/16/2015 10:57  Sodium Latest Ref Range: 135-145 mmol/L 139  Potassium Latest Ref Range: 3.5-5.1 mmol/L 4.2  Chloride Latest Ref Range: 101-111 mmol/L 104  CO2 Latest Ref Range: 22-32 mmol/L 27  BUN Latest Ref Range: 6-20 mg/dL 21 (H)  Creatinine Latest Ref Range: 0.44-1.00 mg/dL 1.43 (H)  Calcium Latest Ref Range: 8.9-10.3 mg/dL 9.5  EGFR (Non-African Amer.) Latest Ref Range: >60 mL/min 32 (L)  EGFR (African American) Latest Ref Range: >60 mL/min 37 (L)  Glucose Latest Ref Range: 65-99 mg/dL 120 (H)  Anion gap Latest Ref Range: 5-15  8  Phosphorus Latest Ref Range: 2.5-4.6 mg/dL 3.4  Albumin Latest Ref Range: 3.5-5.0 g/dL 4.5  Hemoglobin Latest Ref Range: 12.0-15.0 g/dL 11.6 (L)  HCT Latest Ref Range: 36.0-46.0 % 34.9 (L)

## 2015-10-30 ENCOUNTER — Encounter (HOSPITAL_COMMUNITY)
Admission: RE | Admit: 2015-10-30 | Discharge: 2015-10-30 | Disposition: A | Discharge status: Home / Self Care | Payer: Medicare Other | Source: Ambulatory Visit | Attending: Nephrology | Admitting: Nephrology

## 2015-10-30 ENCOUNTER — Encounter (HOSPITAL_COMMUNITY): Payer: Self-pay

## 2015-10-30 DIAGNOSIS — D509 Iron deficiency anemia, unspecified: Secondary | ICD-10-CM | POA: Diagnosis not present

## 2015-10-30 DIAGNOSIS — N183 Chronic kidney disease, stage 3 (moderate): Secondary | ICD-10-CM | POA: Insufficient documentation

## 2015-10-30 LAB — HEMOGLOBIN AND HEMATOCRIT, BLOOD
HCT: 35 % — ABNORMAL LOW (ref 36.0–46.0)
Hemoglobin: 11.8 g/dL — ABNORMAL LOW (ref 12.0–15.0)

## 2015-10-30 NOTE — Progress Notes (Signed)
Results for Brandi Blackburn, Brandi Blackburn (MRN [id number]) as of 10/30/2015 10:49  Ref. Range 10/30/2015 10:25  Hemoglobin Latest Ref Range: 12.0-15.0 g/dL 11.8 (L)  HCT Latest Ref Range: 36.0-46.0 % 35.0 (L)

## 2015-11-13 ENCOUNTER — Encounter (HOSPITAL_COMMUNITY)
Admission: RE | Admit: 2015-11-13 | Discharge: 2015-11-13 | Disposition: A | Discharge status: Home / Self Care | Payer: Medicare Other | Source: Ambulatory Visit | Attending: Nephrology | Admitting: Nephrology

## 2015-11-13 DIAGNOSIS — D509 Iron deficiency anemia, unspecified: Secondary | ICD-10-CM | POA: Diagnosis not present

## 2015-11-13 DIAGNOSIS — N183 Chronic kidney disease, stage 3 (moderate): Secondary | ICD-10-CM | POA: Diagnosis not present

## 2015-11-13 LAB — HEMOGLOBIN AND HEMATOCRIT, BLOOD
HCT: 33.6 % — ABNORMAL LOW (ref 36.0–46.0)
Hemoglobin: 11.3 g/dL — ABNORMAL LOW (ref 12.0–15.0)

## 2015-11-13 LAB — RENAL FUNCTION PANEL
ANION GAP: 6 (ref 5–15)
Albumin: 4.2 g/dL (ref 3.5–5.0)
BUN: 28 mg/dL — ABNORMAL HIGH (ref 6–20)
CALCIUM: 9.4 mg/dL (ref 8.9–10.3)
CO2: 27 mmol/L (ref 22–32)
Chloride: 107 mmol/L (ref 101–111)
Creatinine, Ser: 1.29 mg/dL — ABNORMAL HIGH (ref 0.44–1.00)
GFR calc non Af Amer: 36 mL/min — ABNORMAL LOW (ref >60)
GFR, EST AFRICAN AMERICAN: 42 mL/min — AB (ref >60)
Glucose, Bld: 126 mg/dL — ABNORMAL HIGH (ref 65–99)
POTASSIUM: 4.5 mmol/L (ref 3.5–5.1)
Phosphorus: 3.7 mg/dL (ref 2.5–4.6)
SODIUM: 140 mmol/L (ref 135–145)

## 2015-11-13 NOTE — Progress Notes (Signed)
hgb 11.3 therefore procrit not given. Labs faxed to Dr Lowanda Foster.

## 2015-11-27 ENCOUNTER — Encounter (HOSPITAL_COMMUNITY): Payer: Self-pay

## 2015-11-27 ENCOUNTER — Encounter (HOSPITAL_COMMUNITY)
Admission: RE | Admit: 2015-11-27 | Discharge: 2015-11-27 | Disposition: A | Discharge status: Home / Self Care | Payer: Medicare Other | Source: Ambulatory Visit | Attending: Nephrology | Admitting: Nephrology

## 2015-11-27 DIAGNOSIS — D509 Iron deficiency anemia, unspecified: Secondary | ICD-10-CM | POA: Insufficient documentation

## 2015-11-27 DIAGNOSIS — N183 Chronic kidney disease, stage 3 (moderate): Secondary | ICD-10-CM | POA: Diagnosis not present

## 2015-11-27 LAB — HEMOGLOBIN AND HEMATOCRIT, BLOOD
HCT: 33.3 % — ABNORMAL LOW (ref 36.0–46.0)
Hemoglobin: 11.2 g/dL — ABNORMAL LOW (ref 12.0–15.0)

## 2015-11-27 NOTE — OR Nursing (Signed)
hgb  1102,   hct 33.3  No injection needed .   Appt.  Made for Dec 21 @ 1000 to have blood rechecked. Per order.

## 2015-12-11 ENCOUNTER — Encounter (HOSPITAL_COMMUNITY): Payer: Self-pay

## 2015-12-11 ENCOUNTER — Encounter (HOSPITAL_COMMUNITY)
Admission: RE | Admit: 2015-12-11 | Discharge: 2015-12-11 | Disposition: A | Discharge status: Home / Self Care | Payer: Medicare Other | Source: Ambulatory Visit | Attending: Nephrology | Admitting: Nephrology

## 2015-12-11 ENCOUNTER — Encounter (HOSPITAL_COMMUNITY): Payer: Medicare Other

## 2015-12-11 DIAGNOSIS — D509 Iron deficiency anemia, unspecified: Secondary | ICD-10-CM | POA: Diagnosis not present

## 2015-12-11 DIAGNOSIS — N183 Chronic kidney disease, stage 3 (moderate): Secondary | ICD-10-CM | POA: Diagnosis not present

## 2015-12-11 LAB — RENAL FUNCTION PANEL
ANION GAP: 8 (ref 5–15)
Albumin: 4.2 g/dL (ref 3.5–5.0)
BUN: 23 mg/dL — AB (ref 6–20)
CO2: 29 mmol/L (ref 22–32)
Calcium: 9.4 mg/dL (ref 8.9–10.3)
Chloride: 103 mmol/L (ref 101–111)
Creatinine, Ser: 1.45 mg/dL — ABNORMAL HIGH (ref 0.44–1.00)
GFR calc Af Amer: 36 mL/min — ABNORMAL LOW (ref >60)
GFR, EST NON AFRICAN AMERICAN: 31 mL/min — AB (ref >60)
Glucose, Bld: 119 mg/dL — ABNORMAL HIGH (ref 65–99)
PHOSPHORUS: 4.4 mg/dL (ref 2.5–4.6)
Potassium: 4.4 mmol/L (ref 3.5–5.1)
Sodium: 140 mmol/L (ref 135–145)

## 2015-12-11 LAB — HEMOGLOBIN AND HEMATOCRIT, BLOOD
HCT: 33.5 % — ABNORMAL LOW (ref 36.0–46.0)
HEMOGLOBIN: 11.4 g/dL — AB (ref 12.0–15.0)

## 2015-12-11 NOTE — Progress Notes (Signed)
Results for Brandi Blackburn, Brandi Blackburn (MRN [id number]) as of 12/11/2015 10:53 No procrit required today. Next appointment 12/25/2015 _0    Ref. Range 12/11/2015 10:00  Sodium Latest Ref Range: 135-145 mmol/L 140  Potassium Latest Ref Range: 3.5-5.1 mmol/L 4.4  Chloride Latest Ref Range: 101-111 mmol/L 103  CO2 Latest Ref Range: 22-32 mmol/L 29  BUN Latest Ref Range: 6-20 mg/dL 23 (H)  Creatinine Latest Ref Range: 0.44-1.00 mg/dL 1.45 (H)  Calcium Latest Ref Range: 8.9-10.3 mg/dL 9.4  EGFR (Non-African Amer.) Latest Ref Range: >60 mL/min 31 (L)  EGFR (African American) Latest Ref Range: >60 mL/min 36 (L)  Glucose Latest Ref Range: 65-99 mg/dL 119 (H)  Anion gap Latest Ref Range: 5-15  8  Phosphorus Latest Ref Range: 2.5-4.6 mg/dL 4.4  Albumin Latest Ref Range: 3.5-5.0 g/dL 4.2  Hemoglobin Latest Ref Range: 12.0-15.0 g/dL 11.4 (L)  HCT Latest Ref Range: 36.0-46.0 % 33.5 (L)

## 2015-12-25 ENCOUNTER — Encounter (HOSPITAL_COMMUNITY)
Admission: RE | Admit: 2015-12-25 | Discharge: 2015-12-25 | Disposition: A | Discharge status: Home / Self Care | Payer: Medicare Other | Source: Ambulatory Visit | Attending: Nephrology | Admitting: Nephrology

## 2015-12-25 DIAGNOSIS — D509 Iron deficiency anemia, unspecified: Secondary | ICD-10-CM | POA: Diagnosis not present

## 2015-12-25 DIAGNOSIS — N183 Chronic kidney disease, stage 3 (moderate): Secondary | ICD-10-CM | POA: Insufficient documentation

## 2015-12-25 LAB — HEMOGLOBIN AND HEMATOCRIT, BLOOD
HEMATOCRIT: 33.7 % — AB (ref 36.0–46.0)
Hemoglobin: 11.3 g/dL — ABNORMAL LOW (ref 12.0–15.0)

## 2015-12-25 NOTE — Progress Notes (Signed)
hgb 11.3 therefore procrit held due to order.

## 2016-01-01 DIAGNOSIS — N183 Chronic kidney disease, stage 3 (moderate): Secondary | ICD-10-CM | POA: Diagnosis not present

## 2016-01-01 DIAGNOSIS — R809 Proteinuria, unspecified: Secondary | ICD-10-CM | POA: Diagnosis not present

## 2016-01-01 DIAGNOSIS — I10 Essential (primary) hypertension: Secondary | ICD-10-CM | POA: Diagnosis not present

## 2016-01-01 DIAGNOSIS — D649 Anemia, unspecified: Secondary | ICD-10-CM | POA: Diagnosis not present

## 2016-01-08 ENCOUNTER — Encounter (HOSPITAL_COMMUNITY)
Admission: RE | Admit: 2016-01-08 | Discharge: 2016-01-08 | Disposition: A | Discharge status: Home / Self Care | Payer: Medicare Other | Source: Ambulatory Visit | Attending: Nephrology | Admitting: Nephrology

## 2016-01-08 ENCOUNTER — Encounter (HOSPITAL_COMMUNITY): Payer: Medicare Other

## 2016-03-31 DIAGNOSIS — E782 Mixed hyperlipidemia: Secondary | ICD-10-CM | POA: Diagnosis not present

## 2016-03-31 DIAGNOSIS — E039 Hypothyroidism, unspecified: Secondary | ICD-10-CM | POA: Diagnosis not present

## 2016-03-31 DIAGNOSIS — R51 Headache: Secondary | ICD-10-CM | POA: Diagnosis not present

## 2016-03-31 DIAGNOSIS — D519 Vitamin B12 deficiency anemia, unspecified: Secondary | ICD-10-CM | POA: Diagnosis not present

## 2016-03-31 DIAGNOSIS — Z1389 Encounter for screening for other disorder: Secondary | ICD-10-CM | POA: Diagnosis not present

## 2016-03-31 DIAGNOSIS — Z6822 Body mass index (BMI) 22.0-22.9, adult: Secondary | ICD-10-CM | POA: Diagnosis not present

## 2016-03-31 DIAGNOSIS — J302 Other seasonal allergic rhinitis: Secondary | ICD-10-CM | POA: Diagnosis not present

## 2016-04-09 DIAGNOSIS — Z1389 Encounter for screening for other disorder: Secondary | ICD-10-CM | POA: Diagnosis not present

## 2016-04-09 DIAGNOSIS — Z0000 Encounter for general adult medical examination without abnormal findings: Secondary | ICD-10-CM | POA: Diagnosis not present

## 2016-04-09 DIAGNOSIS — Z6823 Body mass index (BMI) 23.0-23.9, adult: Secondary | ICD-10-CM | POA: Diagnosis not present

## 2016-04-22 ENCOUNTER — Other Ambulatory Visit: Payer: Self-pay | Admitting: Family Medicine

## 2016-04-22 DIAGNOSIS — R5381 Other malaise: Secondary | ICD-10-CM

## 2016-05-07 ENCOUNTER — Other Ambulatory Visit: Payer: Self-pay | Admitting: Family Medicine

## 2016-05-07 DIAGNOSIS — M810 Age-related osteoporosis without current pathological fracture: Secondary | ICD-10-CM

## 2016-07-07 DIAGNOSIS — E559 Vitamin D deficiency, unspecified: Secondary | ICD-10-CM | POA: Diagnosis not present

## 2016-07-07 DIAGNOSIS — N183 Chronic kidney disease, stage 3 (moderate): Secondary | ICD-10-CM | POA: Diagnosis not present

## 2016-07-07 DIAGNOSIS — D509 Iron deficiency anemia, unspecified: Secondary | ICD-10-CM | POA: Diagnosis not present

## 2016-07-07 DIAGNOSIS — R809 Proteinuria, unspecified: Secondary | ICD-10-CM | POA: Diagnosis not present

## 2016-07-07 DIAGNOSIS — I10 Essential (primary) hypertension: Secondary | ICD-10-CM | POA: Diagnosis not present

## 2016-07-07 DIAGNOSIS — Z79899 Other long term (current) drug therapy: Secondary | ICD-10-CM | POA: Diagnosis not present

## 2017-04-27 DIAGNOSIS — E119 Type 2 diabetes mellitus without complications: Secondary | ICD-10-CM | POA: Diagnosis not present

## 2017-04-27 DIAGNOSIS — H52201 Unspecified astigmatism, right eye: Secondary | ICD-10-CM | POA: Diagnosis not present

## 2017-04-27 DIAGNOSIS — H52202 Unspecified astigmatism, left eye: Secondary | ICD-10-CM | POA: Diagnosis not present

## 2017-04-27 DIAGNOSIS — H5201 Hypermetropia, right eye: Secondary | ICD-10-CM | POA: Diagnosis not present

## 2017-04-27 DIAGNOSIS — H524 Presbyopia: Secondary | ICD-10-CM | POA: Diagnosis not present

## 2017-04-27 DIAGNOSIS — H5212 Myopia, left eye: Secondary | ICD-10-CM | POA: Diagnosis not present

## 2017-04-27 DIAGNOSIS — H2513 Age-related nuclear cataract, bilateral: Secondary | ICD-10-CM | POA: Diagnosis not present

## 2017-05-12 DIAGNOSIS — N184 Chronic kidney disease, stage 4 (severe): Secondary | ICD-10-CM | POA: Diagnosis not present

## 2017-05-12 DIAGNOSIS — Z6821 Body mass index (BMI) 21.0-21.9, adult: Secondary | ICD-10-CM | POA: Diagnosis not present

## 2017-05-12 DIAGNOSIS — E782 Mixed hyperlipidemia: Secondary | ICD-10-CM | POA: Diagnosis not present

## 2017-05-12 DIAGNOSIS — I10 Essential (primary) hypertension: Secondary | ICD-10-CM | POA: Diagnosis not present

## 2017-05-12 DIAGNOSIS — Z1389 Encounter for screening for other disorder: Secondary | ICD-10-CM | POA: Diagnosis not present

## 2017-05-12 DIAGNOSIS — E063 Autoimmune thyroiditis: Secondary | ICD-10-CM | POA: Diagnosis not present

## 2017-08-31 DIAGNOSIS — Z0000 Encounter for general adult medical examination without abnormal findings: Secondary | ICD-10-CM | POA: Diagnosis not present

## 2017-08-31 DIAGNOSIS — Z6820 Body mass index (BMI) 20.0-20.9, adult: Secondary | ICD-10-CM | POA: Diagnosis not present

## 2017-12-28 DIAGNOSIS — H25013 Cortical age-related cataract, bilateral: Secondary | ICD-10-CM | POA: Diagnosis not present

## 2017-12-28 DIAGNOSIS — H2513 Age-related nuclear cataract, bilateral: Secondary | ICD-10-CM | POA: Diagnosis not present

## 2017-12-28 DIAGNOSIS — E119 Type 2 diabetes mellitus without complications: Secondary | ICD-10-CM | POA: Diagnosis not present

## 2018-07-01 DIAGNOSIS — Z681 Body mass index (BMI) 19 or less, adult: Secondary | ICD-10-CM | POA: Diagnosis not present

## 2018-07-01 DIAGNOSIS — G894 Chronic pain syndrome: Secondary | ICD-10-CM | POA: Diagnosis not present

## 2018-07-01 DIAGNOSIS — Z79899 Other long term (current) drug therapy: Secondary | ICD-10-CM | POA: Diagnosis not present

## 2018-07-01 DIAGNOSIS — E039 Hypothyroidism, unspecified: Secondary | ICD-10-CM | POA: Diagnosis not present

## 2018-07-01 DIAGNOSIS — E063 Autoimmune thyroiditis: Secondary | ICD-10-CM | POA: Diagnosis not present

## 2018-07-01 DIAGNOSIS — Z1389 Encounter for screening for other disorder: Secondary | ICD-10-CM | POA: Diagnosis not present

## 2018-07-01 DIAGNOSIS — G64 Other disorders of peripheral nervous system: Secondary | ICD-10-CM | POA: Diagnosis not present

## 2018-07-01 DIAGNOSIS — E538 Deficiency of other specified B group vitamins: Secondary | ICD-10-CM | POA: Diagnosis not present

## 2018-07-05 DIAGNOSIS — D510 Vitamin B12 deficiency anemia due to intrinsic factor deficiency: Secondary | ICD-10-CM | POA: Diagnosis not present

## 2018-07-20 ENCOUNTER — Other Ambulatory Visit: Payer: Self-pay

## 2018-08-05 DIAGNOSIS — D510 Vitamin B12 deficiency anemia due to intrinsic factor deficiency: Secondary | ICD-10-CM | POA: Diagnosis not present

## 2018-09-05 DIAGNOSIS — Z23 Encounter for immunization: Secondary | ICD-10-CM | POA: Diagnosis not present

## 2018-09-05 DIAGNOSIS — Z1389 Encounter for screening for other disorder: Secondary | ICD-10-CM | POA: Diagnosis not present

## 2018-09-05 DIAGNOSIS — D511 Vitamin B12 deficiency anemia due to selective vitamin B12 malabsorption with proteinuria: Secondary | ICD-10-CM | POA: Diagnosis not present

## 2018-09-05 DIAGNOSIS — Z0001 Encounter for general adult medical examination with abnormal findings: Secondary | ICD-10-CM | POA: Diagnosis not present

## 2018-09-05 DIAGNOSIS — Z6820 Body mass index (BMI) 20.0-20.9, adult: Secondary | ICD-10-CM | POA: Diagnosis not present

## 2018-10-05 DIAGNOSIS — D510 Vitamin B12 deficiency anemia due to intrinsic factor deficiency: Secondary | ICD-10-CM | POA: Diagnosis not present

## 2018-11-07 DIAGNOSIS — D511 Vitamin B12 deficiency anemia due to selective vitamin B12 malabsorption with proteinuria: Secondary | ICD-10-CM | POA: Diagnosis not present

## 2018-12-05 DIAGNOSIS — D510 Vitamin B12 deficiency anemia due to intrinsic factor deficiency: Secondary | ICD-10-CM | POA: Diagnosis not present

## 2019-01-09 DIAGNOSIS — D511 Vitamin B12 deficiency anemia due to selective vitamin B12 malabsorption with proteinuria: Secondary | ICD-10-CM | POA: Diagnosis not present

## 2019-02-06 DIAGNOSIS — D510 Vitamin B12 deficiency anemia due to intrinsic factor deficiency: Secondary | ICD-10-CM | POA: Diagnosis not present

## 2019-03-06 DIAGNOSIS — D510 Vitamin B12 deficiency anemia due to intrinsic factor deficiency: Secondary | ICD-10-CM | POA: Diagnosis not present

## 2019-04-07 DIAGNOSIS — D510 Vitamin B12 deficiency anemia due to intrinsic factor deficiency: Secondary | ICD-10-CM | POA: Diagnosis not present

## 2019-05-05 DIAGNOSIS — D510 Vitamin B12 deficiency anemia due to intrinsic factor deficiency: Secondary | ICD-10-CM | POA: Diagnosis not present

## 2019-06-08 DIAGNOSIS — D511 Vitamin B12 deficiency anemia due to selective vitamin B12 malabsorption with proteinuria: Secondary | ICD-10-CM | POA: Diagnosis not present

## 2019-07-06 DIAGNOSIS — D511 Vitamin B12 deficiency anemia due to selective vitamin B12 malabsorption with proteinuria: Secondary | ICD-10-CM | POA: Diagnosis not present

## 2019-08-07 DIAGNOSIS — D511 Vitamin B12 deficiency anemia due to selective vitamin B12 malabsorption with proteinuria: Secondary | ICD-10-CM | POA: Diagnosis not present

## 2019-09-06 DIAGNOSIS — D510 Vitamin B12 deficiency anemia due to intrinsic factor deficiency: Secondary | ICD-10-CM | POA: Diagnosis not present

## 2019-10-06 DIAGNOSIS — Z23 Encounter for immunization: Secondary | ICD-10-CM | POA: Diagnosis not present

## 2019-10-06 DIAGNOSIS — Z681 Body mass index (BMI) 19 or less, adult: Secondary | ICD-10-CM | POA: Diagnosis not present

## 2019-10-06 DIAGNOSIS — Z0001 Encounter for general adult medical examination with abnormal findings: Secondary | ICD-10-CM | POA: Diagnosis not present

## 2019-10-06 DIAGNOSIS — D511 Vitamin B12 deficiency anemia due to selective vitamin B12 malabsorption with proteinuria: Secondary | ICD-10-CM | POA: Diagnosis not present

## 2019-10-06 DIAGNOSIS — Z1389 Encounter for screening for other disorder: Secondary | ICD-10-CM | POA: Diagnosis not present

## 2019-11-08 DIAGNOSIS — D511 Vitamin B12 deficiency anemia due to selective vitamin B12 malabsorption with proteinuria: Secondary | ICD-10-CM | POA: Diagnosis not present

## 2019-12-21 DIAGNOSIS — N184 Chronic kidney disease, stage 4 (severe): Secondary | ICD-10-CM | POA: Diagnosis not present

## 2019-12-21 DIAGNOSIS — E7849 Other hyperlipidemia: Secondary | ICD-10-CM | POA: Diagnosis not present

## 2019-12-21 DIAGNOSIS — E063 Autoimmune thyroiditis: Secondary | ICD-10-CM | POA: Diagnosis not present

## 2019-12-21 DIAGNOSIS — I129 Hypertensive chronic kidney disease with stage 1 through stage 4 chronic kidney disease, or unspecified chronic kidney disease: Secondary | ICD-10-CM | POA: Diagnosis not present

## 2020-01-09 DIAGNOSIS — D511 Vitamin B12 deficiency anemia due to selective vitamin B12 malabsorption with proteinuria: Secondary | ICD-10-CM | POA: Diagnosis not present

## 2020-02-06 DIAGNOSIS — D511 Vitamin B12 deficiency anemia due to selective vitamin B12 malabsorption with proteinuria: Secondary | ICD-10-CM | POA: Diagnosis not present

## 2020-03-02 ENCOUNTER — Ambulatory Visit: Payer: Medicare Other | Attending: Internal Medicine

## 2020-03-02 DIAGNOSIS — Z23 Encounter for immunization: Secondary | ICD-10-CM

## 2020-03-02 NOTE — Progress Notes (Signed)
   Covid-19 Vaccination Clinic  Name:  Brandi Blackburn    MRN: [id number] DOB: March 05, 1926  03/02/2020  Brandi Blackburn was observed post Covid-19 immunization for 30 minutes based on pre-vaccination screening without incident. She was provided with Vaccine Information Sheet and instruction to access the V-Safe system.   Brandi Blackburn was instructed to call 911 with any severe reactions post vaccine: Marland Kitchen Difficulty breathing  . Swelling of face and throat  . A fast heartbeat  . A bad rash all over body  . Dizziness and weakness   Immunizations Administered    Name Date Dose VIS Date Route   Moderna COVID-19 Vaccine 03/02/2020  9:59 AM 0.5 mL 11/21/2019 Intramuscular   Manufacturer: Moderna   Lot: [id number]   [place]: [id number]

## 2020-04-03 ENCOUNTER — Ambulatory Visit: Payer: Medicare Other | Attending: Internal Medicine

## 2020-04-03 DIAGNOSIS — Z23 Encounter for immunization: Secondary | ICD-10-CM

## 2020-04-03 NOTE — Progress Notes (Signed)
   Covid-19 Vaccination Clinic  Name:  Brandi Blackburn    MRN: [id number] DOB: 1926-05-11  04/03/2020  Brandi Blackburn was observed post Covid-19 immunization for 15 minutes without incident. She was provided with Vaccine Information Sheet and instruction to access the V-Safe system.   Brandi Blackburn was instructed to call 911 with any severe reactions post vaccine: Marland Kitchen Difficulty breathing  . Swelling of face and throat  . A fast heartbeat  . A bad rash all over body  . Dizziness and weakness   Immunizations Administered    Name Date Dose VIS Date Route   Moderna COVID-19 Vaccine 04/03/2020  9:10 AM 0.5 mL 11/21/2019 Intramuscular   Manufacturer: Moderna   Lot: [id number]   [place]: [id number]

## 2020-04-17 DIAGNOSIS — D511 Vitamin B12 deficiency anemia due to selective vitamin B12 malabsorption with proteinuria: Secondary | ICD-10-CM | POA: Diagnosis not present

## 2020-05-21 DIAGNOSIS — D511 Vitamin B12 deficiency anemia due to selective vitamin B12 malabsorption with proteinuria: Secondary | ICD-10-CM | POA: Diagnosis not present

## 2020-07-16 DIAGNOSIS — D511 Vitamin B12 deficiency anemia due to selective vitamin B12 malabsorption with proteinuria: Secondary | ICD-10-CM | POA: Diagnosis not present

## 2020-08-20 DIAGNOSIS — E7849 Other hyperlipidemia: Secondary | ICD-10-CM | POA: Diagnosis not present

## 2020-08-20 DIAGNOSIS — E063 Autoimmune thyroiditis: Secondary | ICD-10-CM | POA: Diagnosis not present

## 2020-08-20 DIAGNOSIS — I129 Hypertensive chronic kidney disease with stage 1 through stage 4 chronic kidney disease, or unspecified chronic kidney disease: Secondary | ICD-10-CM | POA: Diagnosis not present

## 2020-08-20 DIAGNOSIS — N184 Chronic kidney disease, stage 4 (severe): Secondary | ICD-10-CM | POA: Diagnosis not present

## 2020-09-17 DIAGNOSIS — D511 Vitamin B12 deficiency anemia due to selective vitamin B12 malabsorption with proteinuria: Secondary | ICD-10-CM | POA: Diagnosis not present

## 2020-10-19 DIAGNOSIS — I129 Hypertensive chronic kidney disease with stage 1 through stage 4 chronic kidney disease, or unspecified chronic kidney disease: Secondary | ICD-10-CM | POA: Diagnosis not present

## 2020-10-19 DIAGNOSIS — N184 Chronic kidney disease, stage 4 (severe): Secondary | ICD-10-CM | POA: Diagnosis not present

## 2020-10-19 DIAGNOSIS — E7849 Other hyperlipidemia: Secondary | ICD-10-CM | POA: Diagnosis not present

## 2020-10-19 DIAGNOSIS — E063 Autoimmune thyroiditis: Secondary | ICD-10-CM | POA: Diagnosis not present

## 2020-10-21 DIAGNOSIS — D511 Vitamin B12 deficiency anemia due to selective vitamin B12 malabsorption with proteinuria: Secondary | ICD-10-CM | POA: Diagnosis not present

## 2020-10-30 DIAGNOSIS — H25813 Combined forms of age-related cataract, bilateral: Secondary | ICD-10-CM | POA: Diagnosis not present

## 2020-11-04 DIAGNOSIS — Z1331 Encounter for screening for depression: Secondary | ICD-10-CM | POA: Diagnosis not present

## 2020-11-04 DIAGNOSIS — E559 Vitamin D deficiency, unspecified: Secondary | ICD-10-CM | POA: Diagnosis not present

## 2020-11-04 DIAGNOSIS — I10 Essential (primary) hypertension: Secondary | ICD-10-CM | POA: Diagnosis not present

## 2020-11-04 DIAGNOSIS — E538 Deficiency of other specified B group vitamins: Secondary | ICD-10-CM | POA: Diagnosis not present

## 2020-11-04 DIAGNOSIS — E039 Hypothyroidism, unspecified: Secondary | ICD-10-CM | POA: Diagnosis not present

## 2020-11-04 DIAGNOSIS — Z0001 Encounter for general adult medical examination with abnormal findings: Secondary | ICD-10-CM | POA: Diagnosis not present

## 2020-11-04 DIAGNOSIS — E782 Mixed hyperlipidemia: Secondary | ICD-10-CM | POA: Diagnosis not present

## 2020-11-04 DIAGNOSIS — Z1389 Encounter for screening for other disorder: Secondary | ICD-10-CM | POA: Diagnosis not present

## 2020-11-04 DIAGNOSIS — Z681 Body mass index (BMI) 19 or less, adult: Secondary | ICD-10-CM | POA: Diagnosis not present

## 2020-11-19 DIAGNOSIS — D511 Vitamin B12 deficiency anemia due to selective vitamin B12 malabsorption with proteinuria: Secondary | ICD-10-CM | POA: Diagnosis not present

## 2020-11-22 DIAGNOSIS — Z23 Encounter for immunization: Secondary | ICD-10-CM | POA: Diagnosis not present

## 2020-12-20 DIAGNOSIS — E7849 Other hyperlipidemia: Secondary | ICD-10-CM | POA: Diagnosis not present

## 2020-12-20 DIAGNOSIS — E063 Autoimmune thyroiditis: Secondary | ICD-10-CM | POA: Diagnosis not present

## 2020-12-20 DIAGNOSIS — I129 Hypertensive chronic kidney disease with stage 1 through stage 4 chronic kidney disease, or unspecified chronic kidney disease: Secondary | ICD-10-CM | POA: Diagnosis not present

## 2020-12-20 DIAGNOSIS — N184 Chronic kidney disease, stage 4 (severe): Secondary | ICD-10-CM | POA: Diagnosis not present

## 2021-01-18 DIAGNOSIS — E7849 Other hyperlipidemia: Secondary | ICD-10-CM | POA: Diagnosis not present

## 2021-01-18 DIAGNOSIS — E063 Autoimmune thyroiditis: Secondary | ICD-10-CM | POA: Diagnosis not present

## 2021-01-18 DIAGNOSIS — I129 Hypertensive chronic kidney disease with stage 1 through stage 4 chronic kidney disease, or unspecified chronic kidney disease: Secondary | ICD-10-CM | POA: Diagnosis not present

## 2021-01-18 DIAGNOSIS — N184 Chronic kidney disease, stage 4 (severe): Secondary | ICD-10-CM | POA: Diagnosis not present

## 2021-01-28 DIAGNOSIS — D511 Vitamin B12 deficiency anemia due to selective vitamin B12 malabsorption with proteinuria: Secondary | ICD-10-CM | POA: Diagnosis not present

## 2021-02-10 DIAGNOSIS — H01005 Unspecified blepharitis left lower eyelid: Secondary | ICD-10-CM | POA: Diagnosis not present

## 2021-02-10 DIAGNOSIS — H25813 Combined forms of age-related cataract, bilateral: Secondary | ICD-10-CM | POA: Diagnosis not present

## 2021-02-10 DIAGNOSIS — H01004 Unspecified blepharitis left upper eyelid: Secondary | ICD-10-CM | POA: Diagnosis not present

## 2021-02-10 DIAGNOSIS — H01001 Unspecified blepharitis right upper eyelid: Secondary | ICD-10-CM | POA: Diagnosis not present

## 2021-02-10 DIAGNOSIS — H01002 Unspecified blepharitis right lower eyelid: Secondary | ICD-10-CM | POA: Diagnosis not present

## 2021-02-10 DIAGNOSIS — H25812 Combined forms of age-related cataract, left eye: Secondary | ICD-10-CM | POA: Diagnosis not present

## 2021-02-11 NOTE — H&P (Signed)
Surgical History & Physical  Patient Name: Brandi Blackburn DOB: [birth year]  Surgery: Cataract extraction with intraocular lens implant phacoemulsification; Left Eye  Surgeon: Baruch Goldmann MD Surgery Date:  02/17/2021 Pre-Op Date:  02/10/2021  HPI: A 34 Yr. old female patient Pt referred by Dr. Jorja Loa for cataract evaluation. The patient complains of difficulty when viewing TV, reading closed caption, news scrolls on TV, which began many years ago. Both eyes are affected. The condition's severity is worsening. The complaint is associated with glare and halos. Symptoms are negatively affecting pt's quality of life. The patient experiences no increase in flashes, floater, shadow, curtain or veil. HPI Completed by Dr. Baruch Goldmann  Medical History: Dry Eyes Cataracts High Blood Pressure Thyroid Problems  Review of Systems Negative Allergic/Immunologic Negative Cardiovascular Negative Constitutional Negative Ear, Nose, Mouth & Throat Negative Endocrine Negative Eyes Negative Gastrointestinal Negative Genitourinary Negative Hemotologic/Lymphatic Negative Integumentary Negative Musculoskeletal Negative Neurological Negative Psychiatry Negative Respiratory  Social   Never smoked   Medication Artificial Tears,  Triamterene, Levothyroxine Sodium, Vitamin D3, Alendronate,   Sx/Procedures Gallbladder Sx, Appendectomy,   Drug Allergies  Peniciilin,   History & Physical: Heent:  Cataract, Left eye NECK: supple without bruits LUNGS: lungs clear to auscultation CV: regular rate and rhythm Abdomen: soft and non-tender  Impression & Plan: Assessment: 1.  COMBINED FORMS AGE RELATED CATARACT; Both Eyes (H25.813) 2.  BLEPHARITIS; Right Upper Lid, Right Lower Lid, Left Upper Lid, Left Lower Lid (H01.001, H01.002,H01.004,H01.005)  Plan: 1.  Cataract accounts for the patient's decreased vision. This visual impairment is not correctable with a tolerable change in glasses or  contact lenses. Cataract surgery with an implantation of a new lens should significantly improve the visual and functional status of the patient. Discussed all risks, benefits, alternatives, and potential complications. Discussed the procedures and recovery. Patient desires to have surgery. A-scan ordered and performed today for intra-ocular lens calculations. The surgery will be performed in order to improve vision for driving, reading, and for eye examinations. Recommend phacoemulsification with intra-ocular lens. Recommend Dextenza for post-operative pain and inflammation. Left Eye worse - first. Dilates well - shugarcaine by protocol. Vision Blue in Room. 2.  regular lid cleaning.

## 2021-02-12 ENCOUNTER — Encounter (HOSPITAL_COMMUNITY)
Admission: RE | Admit: 2021-02-12 | Discharge: 2021-02-12 | Disposition: A | Discharge status: Home / Self Care | Payer: Medicare Other | Source: Ambulatory Visit | Attending: Ophthalmology | Admitting: Ophthalmology

## 2021-02-14 ENCOUNTER — Other Ambulatory Visit (HOSPITAL_COMMUNITY)
Admission: RE | Admit: 2021-02-14 | Discharge: 2021-02-14 | Disposition: A | Discharge status: Home / Self Care | Payer: Medicare Other | Source: Ambulatory Visit | Attending: Ophthalmology | Admitting: Ophthalmology

## 2021-02-14 ENCOUNTER — Other Ambulatory Visit: Payer: Self-pay

## 2021-02-14 DIAGNOSIS — Z01812 Encounter for preprocedural laboratory examination: Secondary | ICD-10-CM | POA: Insufficient documentation

## 2021-02-14 DIAGNOSIS — Z20822 Contact with and (suspected) exposure to covid-19: Secondary | ICD-10-CM | POA: Diagnosis not present

## 2021-02-14 LAB — SARS CORONAVIRUS 2 (TAT 6-24 HRS): SARS Coronavirus 2: NEGATIVE

## 2021-02-17 ENCOUNTER — Encounter (HOSPITAL_COMMUNITY)
Admission: RE | Disposition: A | Discharge status: Home / Self Care | Payer: Self-pay | Source: Home / Self Care | Attending: Ophthalmology

## 2021-02-17 ENCOUNTER — Ambulatory Visit (HOSPITAL_COMMUNITY): Payer: Medicare Other | Admitting: Anesthesiology

## 2021-02-17 ENCOUNTER — Encounter (HOSPITAL_COMMUNITY): Payer: Self-pay | Admitting: Ophthalmology

## 2021-02-17 ENCOUNTER — Ambulatory Visit (HOSPITAL_COMMUNITY)
Admission: RE | Admit: 2021-02-17 | Discharge: 2021-02-17 | Disposition: A | Discharge status: Home / Self Care | Payer: Medicare Other | Attending: Ophthalmology | Admitting: Ophthalmology

## 2021-02-17 DIAGNOSIS — Z880 Allergy status to penicillin: Secondary | ICD-10-CM | POA: Insufficient documentation

## 2021-02-17 DIAGNOSIS — H0100A Unspecified blepharitis right eye, upper and lower eyelids: Secondary | ICD-10-CM | POA: Diagnosis not present

## 2021-02-17 DIAGNOSIS — E063 Autoimmune thyroiditis: Secondary | ICD-10-CM | POA: Diagnosis not present

## 2021-02-17 DIAGNOSIS — H25813 Combined forms of age-related cataract, bilateral: Secondary | ICD-10-CM | POA: Diagnosis not present

## 2021-02-17 DIAGNOSIS — I129 Hypertensive chronic kidney disease with stage 1 through stage 4 chronic kidney disease, or unspecified chronic kidney disease: Secondary | ICD-10-CM | POA: Diagnosis not present

## 2021-02-17 DIAGNOSIS — H25812 Combined forms of age-related cataract, left eye: Secondary | ICD-10-CM | POA: Diagnosis not present

## 2021-02-17 DIAGNOSIS — E039 Hypothyroidism, unspecified: Secondary | ICD-10-CM | POA: Diagnosis not present

## 2021-02-17 DIAGNOSIS — E7849 Other hyperlipidemia: Secondary | ICD-10-CM | POA: Diagnosis not present

## 2021-02-17 DIAGNOSIS — Z79899 Other long term (current) drug therapy: Secondary | ICD-10-CM | POA: Insufficient documentation

## 2021-02-17 DIAGNOSIS — H0100B Unspecified blepharitis left eye, upper and lower eyelids: Secondary | ICD-10-CM | POA: Insufficient documentation

## 2021-02-17 DIAGNOSIS — N184 Chronic kidney disease, stage 4 (severe): Secondary | ICD-10-CM | POA: Diagnosis not present

## 2021-02-17 HISTORY — PX: CATARACT EXTRACTION W/PHACO: SHX586

## 2021-02-17 SURGERY — PHACOEMULSIFICATION, CATARACT, WITH IOL INSERTION
Anesthesia: Monitor Anesthesia Care | Site: Eye | Laterality: Left

## 2021-02-17 MED ORDER — LIDOCAINE HCL 3.5 % OP GEL
1.0000 "application " | Freq: Once | OPHTHALMIC | Status: AC
  Administered 2021-02-17: 1 via OPHTHALMIC

## 2021-02-17 MED ORDER — TETRACAINE HCL 0.5 % OP SOLN
1.0000 [drp] | OPHTHALMIC | Status: AC | PRN
  Administered 2021-02-17 (×3): 1 [drp] via OPHTHALMIC

## 2021-02-17 MED ORDER — POVIDONE-IODINE 5 % OP SOLN
OPHTHALMIC | Status: DC | PRN
  Administered 2021-02-17: 1 via OPHTHALMIC

## 2021-02-17 MED ORDER — TRYPAN BLUE 0.06 % OP SOLN
OPHTHALMIC | Status: AC
  Filled 2021-02-17: qty 0.5

## 2021-02-17 MED ORDER — STERILE WATER FOR IRRIGATION IR SOLN
Status: DC | PRN
  Administered 2021-02-17: 250 mL

## 2021-02-17 MED ORDER — NEOMYCIN-POLYMYXIN-DEXAMETH 3.5-10000-0.1 OP SUSP
OPHTHALMIC | Status: DC | PRN
  Administered 2021-02-17: 1 [drp] via OPHTHALMIC

## 2021-02-17 MED ORDER — BSS IO SOLN
INTRAOCULAR | Status: DC | PRN
  Administered 2021-02-17: 15 mL via INTRAOCULAR

## 2021-02-17 MED ORDER — EPINEPHRINE PF 1 MG/ML IJ SOLN
INTRAOCULAR | Status: DC | PRN
  Administered 2021-02-17: 500 mL

## 2021-02-17 MED ORDER — TROPICAMIDE 1 % OP SOLN
1.0000 [drp] | OPHTHALMIC | Status: AC
  Administered 2021-02-17 (×3): 1 [drp] via OPHTHALMIC

## 2021-02-17 MED ORDER — SODIUM HYALURONATE 23 MG/ML IO SOLN
INTRAOCULAR | Status: DC | PRN
  Administered 2021-02-17: 0.6 mL via INTRAOCULAR

## 2021-02-17 MED ORDER — PROVISC 10 MG/ML IO SOLN
INTRAOCULAR | Status: DC | PRN
  Administered 2021-02-17: 0.85 mL via INTRAOCULAR

## 2021-02-17 MED ORDER — TRYPAN BLUE 0.06 % OP SOLN
OPHTHALMIC | Status: DC | PRN
Start: 2021-02-17 — End: 2021-02-17
  Administered 2021-02-17 (×2): 0.5 mL via INTRAOCULAR

## 2021-02-17 MED ORDER — LIDOCAINE HCL (PF) 1 % IJ SOLN
INTRAOCULAR | Status: DC | PRN
  Administered 2021-02-17: 1 mL via OPHTHALMIC

## 2021-02-17 MED ORDER — PHENYLEPHRINE HCL 2.5 % OP SOLN
1.0000 [drp] | OPHTHALMIC | Status: AC | PRN
  Administered 2021-02-17 (×3): 1 [drp] via OPHTHALMIC

## 2021-02-17 SURGICAL SUPPLY — 12 items
CLOTH BEACON ORANGE TIMEOUT ST (SAFETY) ×2 IMPLANT
EYE SHIELD UNIVERSAL CLEAR (GAUZE/BANDAGES/DRESSINGS) ×2 IMPLANT
GLOVE SURG UNDER POLY LF SZ6.5 (GLOVE) ×2 IMPLANT
GLOVE SURG UNDER POLY LF SZ7 (GLOVE) ×2 IMPLANT
NEEDLE HYPO 18GX1.5 BLUNT FILL (NEEDLE) ×2 IMPLANT
PAD ARMBOARD 7.5X6 YLW CONV (MISCELLANEOUS) ×2 IMPLANT
SYR TB 1ML LL NO SAFETY (SYRINGE) ×2 IMPLANT
TAPE SURG TRANSPORE 1 IN (GAUZE/BANDAGES/DRESSINGS) ×1 IMPLANT
TAPE SURGICAL TRANSPORE 1 IN (GAUZE/BANDAGES/DRESSINGS) ×2
Tecnis IOL (Intraocular Lens) ×2 IMPLANT
VISCOELASTIC ADDITIONAL (OPHTHALMIC RELATED) IMPLANT
WATER STERILE IRR 250ML POUR (IV SOLUTION) ×2 IMPLANT

## 2021-02-17 NOTE — Anesthesia Preprocedure Evaluation (Signed)
Anesthesia Evaluation  Patient identified by MRN, date of birth, ID band Patient awake    Reviewed: Allergy & Precautions, NPO status , Patient's Chart, lab work & pertinent test results  History of Anesthesia Complications Negative for: history of anesthetic complications  Airway Mallampati: II  TM Distance: >3 FB Neck ROM: Full    Dental  (+) Dental Advisory Given, Missing   Pulmonary neg pulmonary ROS,    Pulmonary exam normal breath sounds clear to auscultation       Cardiovascular Exercise Tolerance: Good hypertension, Pt. on medications Normal cardiovascular exam Rhythm:Regular Rate:Normal     Neuro/Psych negative neurological ROS  negative psych ROS   GI/Hepatic negative GI ROS, Neg liver ROS,   Endo/Other  Hypothyroidism   Renal/GU negative Renal ROS  negative genitourinary   Musculoskeletal negative musculoskeletal ROS (+)   Abdominal   Peds  Hematology  (+) anemia ,   Anesthesia Other Findings   Reproductive/Obstetrics negative OB ROS                             Anesthesia Physical Anesthesia Plan  ASA: III  Anesthesia Plan: MAC   Post-op Pain Management:    Induction: Intravenous  PONV Risk Score and Plan:   Airway Management Planned: Nasal Cannula and Natural Airway  Additional Equipment:   Intra-op Plan:   Post-operative Plan:   Informed Consent: I have reviewed the patients History and Physical, chart, labs and discussed the procedure including the risks, benefits and alternatives for the proposed anesthesia with the patient or authorized representative who has indicated his/her understanding and acceptance.     Dental advisory given  Plan Discussed with: CRNA and Surgeon  Anesthesia Plan Comments:         Anesthesia Quick Evaluation

## 2021-02-17 NOTE — Interval H&P Note (Signed)
History and Physical Interval Note:  02/17/2021 12:03 PM  Brandi Blackburn  has presented today for surgery, with the diagnosis of nuclear cataract left eye.  The various methods of treatment have been discussed with the patient and family. After consideration of risks, benefits and other options for treatment, the patient has consented to  Procedure(s) with comments: CATARACT EXTRACTION PHACO AND INTRAOCULAR LENS PLACEMENT (IOC) (Left) - left, sister Elder Negus as a surgical intervention.  The patient's history has been reviewed, patient examined, no change in status, stable for surgery.  I have reviewed the patient's chart and labs.  Questions were answered to the patient's satisfaction.     Baruch Goldmann

## 2021-02-17 NOTE — Discharge Instructions (Signed)
Please discharge patient when stable, will follow up today with Dr. Malynda Smolinski at the [facility] [place] office immediately following discharge.  Leave shield in place until visit.  All paperwork with discharge instructions will be given at the office.  [facility] [place] Address:  [address]  [place], [place] [address]  

## 2021-02-17 NOTE — Anesthesia Postprocedure Evaluation (Signed)
Anesthesia Post Note  Patient: ARUNA NESTLER  Procedure(s) Performed: CATARACT EXTRACTION PHACO AND INTRAOCULAR LENS PLACEMENT (IOC) (Left Eye)  Patient location during evaluation: Phase II Anesthesia Type: MAC Level of consciousness: awake and alert and oriented Pain management: pain level controlled Vital Signs Assessment: post-procedure vital signs reviewed and stable Respiratory status: spontaneous breathing, nonlabored ventilation and respiratory function stable Cardiovascular status: blood pressure returned to baseline and stable (BP slightly higher than pre-op, denies pain or discomfort.) Postop Assessment: no apparent nausea or vomiting Anesthetic complications: no   No complications documented.   Last Vitals:  Vitals:   02/17/21 1033  BP: (!) 166/75  Pulse: 66  Resp: 18  Temp: 36.8 C  SpO2: 100%    Last Pain:  Vitals:   02/17/21 1033  TempSrc: Oral  PainSc: 0-No pain                 Orlie Dakin

## 2021-02-17 NOTE — Anesthesia Procedure Notes (Signed)
Procedure Name: MAC Date/Time: 02/17/2021 12:11 PM Performed by: Orlie Dakin, CRNA Pre-anesthesia Checklist: Patient identified, Emergency Drugs available, Suction available and Patient being monitored Patient Re-evaluated:Patient Re-evaluated prior to induction Oxygen Delivery Method: Nasal cannula Placement Confirmation: positive ETCO2

## 2021-02-17 NOTE — Transfer of Care (Signed)
Immediate Anesthesia Transfer of Care Note  Patient: Brandi Blackburn  Procedure(s) Performed: CATARACT EXTRACTION PHACO AND INTRAOCULAR LENS PLACEMENT (IOC) (Left Eye)  Patient Location: Short Stay  Anesthesia Type:MAC  Level of Consciousness: awake, alert  and oriented  Airway & Oxygen Therapy: Patient Spontanous Breathing  Post-op Assessment: Report given to RN and Post -op Vital signs reviewed and stable  Post vital signs: Reviewed and stable  Last Vitals:  Vitals Value Taken Time  BP    Temp    Pulse    Resp    SpO2      Last Pain:  Vitals:   02/17/21 1033  TempSrc: Oral  PainSc: 0-No pain      Patients Stated Pain Goal: 5 (40/76/80 8811)  Complications: No complications documented.

## 2021-02-17 NOTE — Op Note (Signed)
Date of procedure: 02/17/21  Pre-operative diagnosis: Visually significant age-related combined cataract, Left Eye (H25.812)  Post-operative diagnosis: Visually significant age-related combined cataract, Left Eye (H25.812)  Procedure: Removal of cataract via phacoemulsification and insertion of intra-ocular lens [employer] [id number]  +20.0D into the capsular bag of the Left Eye  Attending surgeon: Gerda Diss. Cleatus Gabriel, MD, MA  Anesthesia: MAC, Topical Akten  Complications: None  Estimated Blood Loss: <42m (minimal)  Specimens: None  Implants: As above  Indications:  Visually significant age-related cataract, Left Eye  Procedure:  The patient was seen and identified in the pre-operative area. The operative eye was identified and dilated.  The operative eye was marked.  Topical anesthesia was administered to the operative eye.     The patient was then to the operative suite and placed in the supine position.  A timeout was performed confirming the patient, procedure to be performed, and all other relevant information.   The patient's face was prepped and draped in the usual fashion for intra-ocular surgery.  A lid speculum was placed into the operative eye and the surgical microscope moved into place and focused.  An inferotemporal paracentesis was created using a 20 gauge paracentesis blade. Vision Blue was used to stain the anterior capsule. Shugarcaine was injected into the anterior chamber.  Viscoelastic was injected into the anterior chamber.  A temporal clear-corneal main wound incision was created using a 2.473mmicrokeratome.  A continuous curvilinear capsulorrhexis was initiated using an irrigating cystitome and completed using capsulorrhexis forceps.  Hydrodissection and hydrodeliniation were performed.  Viscoelastic was injected into the anterior chamber.  A phacoemulsification handpiece and a chopper as a second instrument were used to remove the nucleus and epinucleus. The  irrigation/aspiration handpiece was used to remove any remaining cortical material.   The capsular bag was reinflated with viscoelastic, checked, and found to be intact.  The intraocular lens was inserted into the capsular bag.  The irrigation/aspiration handpiece was used to remove any remaining viscoelastic.  The clear corneal wound and paracentesis wounds were then hydrated and checked with Weck-Cels to be watertight.  The lid-speculum was removed.  The drape was removed.  The patient's face was cleaned with a wet and dry 4x4.   Maxitrol was instilled in the eye. A clear shield was taped over the eye. The patient was taken to the post-operative care unit in good condition, having tolerated the procedure well.  Post-Op Instructions: [facility] and will receive all other orders and instructions.

## 2021-02-18 ENCOUNTER — Encounter (HOSPITAL_COMMUNITY): Payer: Self-pay | Admitting: Ophthalmology

## 2021-03-14 NOTE — H&P (Signed)
Surgical History & Physical   Patient Name: Brandi Blackburn DOB: 02-18-1926  Surgery: Cataract extraction with intraocular lens implant phacoemulsification; Right Eye  Surgeon: Baruch Goldmann MD Surgery Date:  03/21/2021 Pre-Op Date:  02/24/2021  HPI: A 70 Yr. old female patient 1. The patient complains of difficulty when viewing TV, reading closed caption, news scrolls on TV, which began many years ago. The right eye is affected. The episode is gradual. The condition's severity increased since last visit. Symptoms occur when the patient is inside and outside. This is negatively affecting her quality of life. 2. The patient is returning after cataract post-op. The left eye is affected. Status post cataract post-op, which began 1 year ago: Since the last visit, the affected area is doing well. The patient's vision is improved. Patient is following medication instructions. HPI was performed by Baruch Goldmann .  Medical History: Dry Eyes Cataracts High Blood Pressure Thyroid Problems  Review of Systems Negative Allergic/Immunologic Negative Cardiovascular Negative Constitutional Negative Ear, Nose, Mouth & Throat Negative Endocrine Negative Eyes Negative Gastrointestinal Negative Genitourinary Negative Hemotologic/Lymphatic Negative Integumentary Negative Musculoskeletal Negative Neurological Negative Psychiatry Negative Respiratory  Social   Never smoked   Medication Artificial Tears, Prednisolone-gatiflox-bromfenac,  Triamterene, Levothyroxine Sodium, Vitamin D3, Alendronate,   Sx/Procedures Phaco c IOL OS,  Gallbladder Sx, Appendectomy,   Drug Allergies  Peniciilin,   History & Physical: Heent:  Cataract, Right eye NECK: supple without bruits LUNGS: lungs clear to auscultation CV: regular rate and rhythm Abdomen: soft and non-tender  Impression & Plan: Assessment: 1.  CATARACT EXTRACTION STATUS; Left Eye (Z98.42) 2.  COMBINED FORMS AGE RELATED CATARACT; Both  Eyes (H25.813)  Plan: 1.  1 week after cataract surgery. Doing well with improved vision and normal eye pressure. Call with any problems or concerns. Continue Pred-Moxi-Brom 2x/day for 3 more weeks.  2.  Dilates well - shugarcaine by protocol. Vision Blue in Room. Cataract accounts for the patient's decreased vision. This visual impairment is not correctable with a tolerable change in glasses or contact lenses. Cataract surgery with an implantation of a new lens should significantly improve the visual and functional status of the patient. Discussed all risks, benefits, alternatives, and potential complications. Discussed the procedures and recovery. Patient desires to have surgery. A-scan ordered and performed today for intra-ocular lens calculations. The surgery will be performed in order to improve vision for driving, reading, and for eye examinations. Recommend phacoemulsification with intra-ocular lens. Recommend Dextenza for post-operative pain and inflammation. Right Eye. Surgery required to correct imbalance of vision.

## 2021-03-17 DIAGNOSIS — H25811 Combined forms of age-related cataract, right eye: Secondary | ICD-10-CM | POA: Diagnosis not present

## 2021-03-18 ENCOUNTER — Other Ambulatory Visit: Payer: Self-pay

## 2021-03-18 ENCOUNTER — Encounter (HOSPITAL_COMMUNITY)
Admit: 2021-03-18 | Discharge: 2021-03-18 | Disposition: A | Discharge status: Home / Self Care | Payer: Medicare Other | Attending: Ophthalmology | Admitting: Ophthalmology

## 2021-03-19 ENCOUNTER — Other Ambulatory Visit: Payer: Self-pay

## 2021-03-19 ENCOUNTER — Other Ambulatory Visit (HOSPITAL_COMMUNITY)
Admission: RE | Admit: 2021-03-19 | Discharge: 2021-03-19 | Disposition: A | Discharge status: Home / Self Care | Payer: Medicare Other | Source: Ambulatory Visit | Attending: Ophthalmology | Admitting: Ophthalmology

## 2021-03-19 DIAGNOSIS — Z01812 Encounter for preprocedural laboratory examination: Secondary | ICD-10-CM | POA: Diagnosis not present

## 2021-03-19 DIAGNOSIS — Z20822 Contact with and (suspected) exposure to covid-19: Secondary | ICD-10-CM | POA: Insufficient documentation

## 2021-03-19 LAB — SARS CORONAVIRUS 2 (TAT 6-24 HRS): SARS Coronavirus 2: NEGATIVE

## 2021-03-21 ENCOUNTER — Encounter (HOSPITAL_COMMUNITY)
Admission: RE | Disposition: A | Discharge status: Home / Self Care | Payer: Self-pay | Source: Home / Self Care | Attending: Ophthalmology

## 2021-03-21 ENCOUNTER — Ambulatory Visit (HOSPITAL_COMMUNITY): Payer: Medicare Other | Admitting: Anesthesiology

## 2021-03-21 ENCOUNTER — Ambulatory Visit (HOSPITAL_COMMUNITY)
Admission: RE | Admit: 2021-03-21 | Discharge: 2021-03-21 | Disposition: A | Discharge status: Home / Self Care | Payer: Medicare Other | Attending: Ophthalmology | Admitting: Ophthalmology

## 2021-03-21 DIAGNOSIS — Z9842 Cataract extraction status, left eye: Secondary | ICD-10-CM | POA: Diagnosis not present

## 2021-03-21 DIAGNOSIS — E039 Hypothyroidism, unspecified: Secondary | ICD-10-CM | POA: Diagnosis not present

## 2021-03-21 DIAGNOSIS — H25811 Combined forms of age-related cataract, right eye: Secondary | ICD-10-CM | POA: Insufficient documentation

## 2021-03-21 HISTORY — PX: CATARACT EXTRACTION W/PHACO: SHX586

## 2021-03-21 SURGERY — PHACOEMULSIFICATION, CATARACT, WITH IOL INSERTION
Anesthesia: Monitor Anesthesia Care | Site: Eye | Laterality: Right

## 2021-03-21 MED ORDER — PHENYLEPHRINE HCL 2.5 % OP SOLN
1.0000 [drp] | OPHTHALMIC | Status: AC | PRN
  Administered 2021-03-21 (×3): 1 [drp] via OPHTHALMIC

## 2021-03-21 MED ORDER — LIDOCAINE HCL 3.5 % OP GEL
1.0000 "application " | Freq: Once | OPHTHALMIC | Status: AC
  Administered 2021-03-21: 1 via OPHTHALMIC

## 2021-03-21 MED ORDER — SODIUM HYALURONATE 23 MG/ML IO SOLN
INTRAOCULAR | Status: DC | PRN
  Administered 2021-03-21: 0.6 mL via INTRAOCULAR

## 2021-03-21 MED ORDER — STERILE WATER FOR IRRIGATION IR SOLN
Status: DC | PRN
  Administered 2021-03-21: 500 mL

## 2021-03-21 MED ORDER — EPINEPHRINE PF 1 MG/ML IJ SOLN
INTRAMUSCULAR | Status: AC
  Filled 2021-03-21: qty 2

## 2021-03-21 MED ORDER — EPINEPHRINE PF 1 MG/ML IJ SOLN
INTRAOCULAR | Status: DC | PRN
  Administered 2021-03-21: 500 mL

## 2021-03-21 MED ORDER — TRYPAN BLUE 0.06 % OP SOLN
OPHTHALMIC | Status: AC
  Filled 2021-03-21: qty 0.5

## 2021-03-21 MED ORDER — TROPICAMIDE 1 % OP SOLN
1.0000 [drp] | OPHTHALMIC | Status: AC
Start: 2021-03-21 — End: 2021-03-21
  Administered 2021-03-21 (×3): 1 [drp] via OPHTHALMIC

## 2021-03-21 MED ORDER — BSS IO SOLN
INTRAOCULAR | Status: DC | PRN
  Administered 2021-03-21: 15 mL via INTRAOCULAR

## 2021-03-21 MED ORDER — TETRACAINE HCL 0.5 % OP SOLN
1.0000 [drp] | OPHTHALMIC | Status: AC | PRN
  Administered 2021-03-21 (×3): 1 [drp] via OPHTHALMIC

## 2021-03-21 MED ORDER — POVIDONE-IODINE 5 % OP SOLN
OPHTHALMIC | Status: DC | PRN
  Administered 2021-03-21: 1 via OPHTHALMIC

## 2021-03-21 MED ORDER — PROVISC 10 MG/ML IO SOLN
INTRAOCULAR | Status: DC | PRN
  Administered 2021-03-21: 0.85 mL via INTRAOCULAR

## 2021-03-21 MED ORDER — TRYPAN BLUE 0.06 % OP SOLN
OPHTHALMIC | Status: DC | PRN
  Administered 2021-03-21: 0.5 mL via INTRAOCULAR

## 2021-03-21 MED ORDER — LIDOCAINE HCL (PF) 1 % IJ SOLN
INTRAOCULAR | Status: DC | PRN
  Administered 2021-03-21: 1 mL via OPHTHALMIC

## 2021-03-21 MED ORDER — NEOMYCIN-POLYMYXIN-DEXAMETH 3.5-10000-0.1 OP SUSP
OPHTHALMIC | Status: DC | PRN
  Administered 2021-03-21: 1 [drp] via OPHTHALMIC

## 2021-03-21 SURGICAL SUPPLY — 12 items
CLOTH BEACON ORANGE TIMEOUT ST (SAFETY) ×2 IMPLANT
EYE SHIELD UNIVERSAL CLEAR (GAUZE/BANDAGES/DRESSINGS) ×2 IMPLANT
GLOVE SURG UNDER POLY LF SZ6.5 (GLOVE) ×2 IMPLANT
GLOVE SURG UNDER POLY LF SZ7 (GLOVE) ×2 IMPLANT
NEEDLE HYPO 18GX1.5 BLUNT FILL (NEEDLE) ×2 IMPLANT
PAD ARMBOARD 7.5X6 YLW CONV (MISCELLANEOUS) ×2 IMPLANT
SYR TB 1ML LL NO SAFETY (SYRINGE) ×2 IMPLANT
TAPE SURG TRANSPORE 1 IN (GAUZE/BANDAGES/DRESSINGS) ×1 IMPLANT
TAPE SURGICAL TRANSPORE 1 IN (GAUZE/BANDAGES/DRESSINGS) ×2
TECNIS IOL (Intraocular Lens) ×2 IMPLANT
VISCOELASTIC ADDITIONAL (OPHTHALMIC RELATED) IMPLANT
WATER STERILE IRR 250ML POUR (IV SOLUTION) ×2 IMPLANT

## 2021-03-21 NOTE — Anesthesia Procedure Notes (Signed)
Procedure Name: MAC Date/Time: 03/21/2021 8:31 AM Performed by: Orlie Dakin, CRNA Pre-anesthesia Checklist: Patient identified, Emergency Drugs available, Suction available and Patient being monitored Patient Re-evaluated:Patient Re-evaluated prior to induction Oxygen Delivery Method: Nasal cannula Placement Confirmation: positive ETCO2

## 2021-03-21 NOTE — Op Note (Signed)
Date of procedure: 03/21/21  Pre-operative diagnosis:  Visually significant combined form age-related cataract, Right Eye (H25.811)  Post-operative diagnosis:  Visually significant combined form age-related cataract, Right Eye (H25.811)  Procedure: Removal of cataract via phacoemulsification and insertion of intra-ocular lens Wynetta Emery and [employer] DCB00  +20.0D into the capsular bag of the Right Eye  Attending surgeon: Gerda Diss. Kynsie Falkner, MD, MA  Anesthesia: MAC, Topical Akten  Complications: None  Estimated Blood Loss: <54m (minimal)  Specimens: None  Implants: As above  Indications:  Visually significant age-related cataract, Right Eye  Procedure:  The patient was seen and identified in the pre-operative area. The operative eye was identified and dilated.  The operative eye was marked.  Topical anesthesia was administered to the operative eye.     The patient was then to the operative suite and placed in the supine position.  A timeout was performed confirming the patient, procedure to be performed, and all other relevant information.   The patient's face was prepped and draped in the usual fashion for intra-ocular surgery.  A lid speculum was placed into the operative eye and the surgical microscope moved into place and focused.  A superotemporal paracentesis was created using a 20 gauge paracentesis blade. Vision Blue was used to stain the anterior capsule. Shugarcaine was injected into the anterior chamber.  Viscoelastic was injected into the anterior chamber.  A temporal clear-corneal main wound incision was created using a 2.417mmicrokeratome.  A continuous curvilinear capsulorrhexis was initiated using an irrigating cystitome and completed using capsulorrhexis forceps.  Hydrodissection and hydrodeliniation were performed.  Viscoelastic was injected into the anterior chamber.  A phacoemulsification handpiece and a chopper as a second instrument were used to remove the nucleus and  epinucleus. The irrigation/aspiration handpiece was used to remove any remaining cortical material.   The capsular bag was reinflated with viscoelastic, checked, and found to be intact.  The intraocular lens was inserted into the capsular bag.  The irrigation/aspiration handpiece was used to remove any remaining viscoelastic.  The clear corneal wound and paracentesis wounds were then hydrated and checked with Weck-Cels to be watertight.  The lid-speculum was removed.  The drape was removed.  The patient's face was cleaned with a wet and dry 4x4.   Maxitrol was instilled in the eye. A clear shield was taped over the eye. The patient was taken to the post-operative care unit in good condition, having tolerated the procedure well.  Post-Op Instructions: [facility] and will receive all other orders and instructions.

## 2021-03-21 NOTE — Interval H&P Note (Signed)
History and Physical Interval Note:  03/21/2021 8:20 AM  Brandi Blackburn  has presented today for surgery, with the diagnosis of Nuclear sclerotic cataract - Right eye.  The various methods of treatment have been discussed with the patient and family. After consideration of risks, benefits and other options for treatment, the patient has consented to  Procedure(s) with comments: CATARACT EXTRACTION PHACO AND INTRAOCULAR LENS PLACEMENT (IOC) (Right) - right as a surgical intervention.  The patient's history has been reviewed, patient examined, no change in status, stable for surgery.  I have reviewed the patient's chart and labs.  Questions were answered to the patient's satisfaction.     Baruch Goldmann

## 2021-03-21 NOTE — Anesthesia Postprocedure Evaluation (Signed)
Anesthesia Post Note  Patient: Brandi Blackburn  Procedure(s) Performed: CATARACT EXTRACTION PHACO AND INTRAOCULAR LENS PLACEMENT (IOC) (Right Eye)  Patient location during evaluation: Phase II Anesthesia Type: MAC Level of consciousness: awake and alert and oriented Pain management: pain level controlled Vital Signs Assessment: post-procedure vital signs reviewed and stable Respiratory status: spontaneous breathing and respiratory function stable Cardiovascular status: blood pressure returned to baseline and stable Postop Assessment: no apparent nausea or vomiting Anesthetic complications: no   No complications documented.   Last Vitals:  Vitals:   03/21/21 0730 03/21/21 0848  BP:  (!) 172/77  Pulse:  68  Resp: (!) 21 18  Temp:  36.6 C  SpO2:  100%    Last Pain:  Vitals:   03/21/21 0848  TempSrc: Oral  PainSc: 0-No pain                 Kadie Balestrieri C Haidy Kackley

## 2021-03-21 NOTE — Transfer of Care (Signed)
Immediate Anesthesia Transfer of Care Note  Patient: Brandi Blackburn  Procedure(s) Performed: CATARACT EXTRACTION PHACO AND INTRAOCULAR LENS PLACEMENT (IOC) (Right Eye)  Patient Location: Short Stay  Anesthesia Type:MAC  Level of Consciousness: awake, alert  and oriented  Airway & Oxygen Therapy: Patient Spontanous Breathing  Post-op Assessment: Report given to RN and Post -op Vital signs reviewed and stable  Post vital signs: Reviewed and stable  Last Vitals:  Vitals Value Taken Time  BP    Temp    Pulse    Resp    SpO2      Last Pain:  Vitals:   03/21/21 0715  PainSc: 0-No pain         Complications: No complications documented.

## 2021-03-21 NOTE — Anesthesia Preprocedure Evaluation (Signed)
Anesthesia Evaluation  Patient identified by MRN, date of birth, ID band Patient awake    Reviewed: Allergy & Precautions, NPO status , Patient's Chart, lab work & pertinent test results  History of Anesthesia Complications Negative for: history of anesthetic complications  Airway Mallampati: II  TM Distance: >3 FB Neck ROM: Full    Dental  (+) Dental Advisory Given, Missing   Pulmonary neg pulmonary ROS,    Pulmonary exam normal breath sounds clear to auscultation       Cardiovascular Exercise Tolerance: Good hypertension, Pt. on medications Normal cardiovascular exam Rhythm:Regular Rate:Normal     Neuro/Psych negative neurological ROS  negative psych ROS   GI/Hepatic negative GI ROS, Neg liver ROS,   Endo/Other  Hypothyroidism   Renal/GU negative Renal ROS  negative genitourinary   Musculoskeletal negative musculoskeletal ROS (+)   Abdominal   Peds  Hematology  (+) anemia ,   Anesthesia Other Findings   Reproductive/Obstetrics negative OB ROS                             Anesthesia Physical  Anesthesia Plan  ASA: III  Anesthesia Plan: MAC   Post-op Pain Management:    Induction: Intravenous  PONV Risk Score and Plan:   Airway Management Planned: Nasal Cannula and Natural Airway  Additional Equipment:   Intra-op Plan:   Post-operative Plan:   Informed Consent: I have reviewed the patients History and Physical, chart, labs and discussed the procedure including the risks, benefits and alternatives for the proposed anesthesia with the patient or authorized representative who has indicated his/her understanding and acceptance.     Dental advisory given  Plan Discussed with: CRNA and Surgeon  Anesthesia Plan Comments:         Anesthesia Quick Evaluation

## 2021-03-21 NOTE — Discharge Instructions (Addendum)
Please discharge patient when stable, will follow up today with Dr. Marisa Hua at the [facility] office immediately following discharge.  Leave shield in place until visit.  All paperwork with discharge instructions will be given at the office.  [facility] Address:  [address]  [place], [place] [address]   Cataract Surgery, Care After This sheet gives you information about how to care for yourself after your procedure. Your health care provider may also give you more specific instructions. If you have problems or questions, contact your health care provider. What can I expect after the procedure? After the procedure, it is common to have:  Itching.  Discomfort.  Fluid discharge.  Sensitivity to light and to touch.  Bruising in or around the eye.  Mild blurred vision. Follow these instructions at home: Eye care  Do not touch or rub your eyes.  Protect your eyes as told by your health care provider. You may be told to wear a protective eye shield or sunglasses.  Do not put a contact lens into the affected eye or eyes until your health care provider approves.  Keep the area around your eye clean and dry: ? Avoid swimming. ? Do not allow water to hit you directly in the face while showering. ? Keep soap and shampoo out of your eyes.  Check your eye every day for signs of infection. Watch for: ? Redness, swelling, or pain. ? Fluid, blood, or pus. ? Warmth. ? A bad smell. ? Vision that is getting worse. ? Sensitivity that is getting worse.   Activity  Do not drive for 24 hours if you were given a sedative during your procedure.  Avoid strenuous activities, such as playing contact sports, for as long as told by your health care provider.  Do not drive or use heavy machinery until your health care provider approves.  Do not bend or lift heavy objects. Bending increases pressure in the eye. You can walk, climb stairs, and do light  household chores.  Ask your health care provider when you can return to work. If you work in a dusty environment, you may be advised to wear protective eyewear for a period of time. General instructions  Take or apply over-the-counter and prescription medicines only as told by your health care provider. This includes eye drops.  Keep all follow-up visits as told by your health care provider. This is important. Contact a health care provider if:  You have increased bruising around your eye.  You have pain that is not helped with medicine.  You have a fever.  You have redness, swelling, or pain in your eye.  You have fluid, blood, or pus coming from your incision.  Your vision gets worse.  Your sensitivity to light gets worse. Get help right away if:  You have sudden loss of vision.  You see flashes of light or spots (floaters).  You have severe eye pain.  You develop nausea or vomiting. Summary  After your procedure, it is common to have itching, discomfort, bruising, fluid discharge, or sensitivity to light.  Follow instructions from your health care provider about caring for your eye after the procedure.  Do not rub your eye after the procedure. You may need to wear eye protection or sunglasses. Do not wear contact lenses. Keep the area around your eye clean and dry.  Avoid activities that require a lot of effort. These include playing sports and lifting heavy objects.  Contact a health  care provider if you have increased bruising, pain that does not go away, or a fever. Get help right away if you suddenly lose your vision, see flashes of light or spots, or have severe pain in the eye. This information is not intended to replace advice given to you by your health care provider. Make sure you discuss any questions you have with your health care provider. Document Revised: 10/03/2019 Document Reviewed: 06/06/2018 Elsevier Patient Education  [place].

## 2021-03-24 ENCOUNTER — Encounter (HOSPITAL_COMMUNITY): Payer: Self-pay | Admitting: Ophthalmology

## 2021-03-31 DIAGNOSIS — D510 Vitamin B12 deficiency anemia due to intrinsic factor deficiency: Secondary | ICD-10-CM | POA: Diagnosis not present

## 2021-04-19 DIAGNOSIS — I129 Hypertensive chronic kidney disease with stage 1 through stage 4 chronic kidney disease, or unspecified chronic kidney disease: Secondary | ICD-10-CM | POA: Diagnosis not present

## 2021-04-19 DIAGNOSIS — E7849 Other hyperlipidemia: Secondary | ICD-10-CM | POA: Diagnosis not present

## 2021-04-19 DIAGNOSIS — E063 Autoimmune thyroiditis: Secondary | ICD-10-CM | POA: Diagnosis not present

## 2021-04-19 DIAGNOSIS — N184 Chronic kidney disease, stage 4 (severe): Secondary | ICD-10-CM | POA: Diagnosis not present

## 2021-05-02 DIAGNOSIS — D511 Vitamin B12 deficiency anemia due to selective vitamin B12 malabsorption with proteinuria: Secondary | ICD-10-CM | POA: Diagnosis not present

## 2021-06-04 DIAGNOSIS — E538 Deficiency of other specified B group vitamins: Secondary | ICD-10-CM | POA: Diagnosis not present

## 2021-07-07 DIAGNOSIS — H26493 Other secondary cataract, bilateral: Secondary | ICD-10-CM | POA: Diagnosis not present

## 2021-07-07 DIAGNOSIS — H01002 Unspecified blepharitis right lower eyelid: Secondary | ICD-10-CM | POA: Diagnosis not present

## 2021-07-07 DIAGNOSIS — H11153 Pinguecula, bilateral: Secondary | ICD-10-CM | POA: Diagnosis not present

## 2021-07-07 DIAGNOSIS — H01001 Unspecified blepharitis right upper eyelid: Secondary | ICD-10-CM | POA: Diagnosis not present

## 2021-07-08 DIAGNOSIS — D511 Vitamin B12 deficiency anemia due to selective vitamin B12 malabsorption with proteinuria: Secondary | ICD-10-CM | POA: Diagnosis not present

## 2021-07-25 DIAGNOSIS — Z23 Encounter for immunization: Secondary | ICD-10-CM | POA: Diagnosis not present

## 2021-08-12 DIAGNOSIS — D511 Vitamin B12 deficiency anemia due to selective vitamin B12 malabsorption with proteinuria: Secondary | ICD-10-CM | POA: Diagnosis not present

## 2021-09-15 DIAGNOSIS — E538 Deficiency of other specified B group vitamins: Secondary | ICD-10-CM | POA: Diagnosis not present

## 2021-10-30 DIAGNOSIS — E538 Deficiency of other specified B group vitamins: Secondary | ICD-10-CM | POA: Diagnosis not present

## 2021-12-25 DIAGNOSIS — E538 Deficiency of other specified B group vitamins: Secondary | ICD-10-CM | POA: Diagnosis not present

## 2022-01-22 DIAGNOSIS — E538 Deficiency of other specified B group vitamins: Secondary | ICD-10-CM | POA: Diagnosis not present

## 2022-01-22 DIAGNOSIS — N184 Chronic kidney disease, stage 4 (severe): Secondary | ICD-10-CM | POA: Diagnosis not present

## 2022-01-22 DIAGNOSIS — I10 Essential (primary) hypertension: Secondary | ICD-10-CM | POA: Diagnosis not present

## 2022-01-22 DIAGNOSIS — Z681 Body mass index (BMI) 19 or less, adult: Secondary | ICD-10-CM | POA: Diagnosis not present

## 2022-01-22 DIAGNOSIS — R7309 Other abnormal glucose: Secondary | ICD-10-CM | POA: Diagnosis not present

## 2022-01-22 DIAGNOSIS — E039 Hypothyroidism, unspecified: Secondary | ICD-10-CM | POA: Diagnosis not present

## 2022-03-19 DIAGNOSIS — E538 Deficiency of other specified B group vitamins: Secondary | ICD-10-CM | POA: Diagnosis not present

## 2022-04-21 DIAGNOSIS — E538 Deficiency of other specified B group vitamins: Secondary | ICD-10-CM | POA: Diagnosis not present

## 2022-07-31 DIAGNOSIS — E538 Deficiency of other specified B group vitamins: Secondary | ICD-10-CM | POA: Diagnosis not present

## 2022-10-13 DIAGNOSIS — G629 Polyneuropathy, unspecified: Secondary | ICD-10-CM | POA: Diagnosis not present

## 2022-10-13 DIAGNOSIS — Z681 Body mass index (BMI) 19 or less, adult: Secondary | ICD-10-CM | POA: Diagnosis not present

## 2022-10-13 DIAGNOSIS — E039 Hypothyroidism, unspecified: Secondary | ICD-10-CM | POA: Diagnosis not present

## 2022-10-13 DIAGNOSIS — D518 Other vitamin B12 deficiency anemias: Secondary | ICD-10-CM | POA: Diagnosis not present

## 2022-10-13 DIAGNOSIS — I10 Essential (primary) hypertension: Secondary | ICD-10-CM | POA: Diagnosis not present

## 2022-10-13 DIAGNOSIS — Z0001 Encounter for general adult medical examination with abnormal findings: Secondary | ICD-10-CM | POA: Diagnosis not present

## 2022-10-13 DIAGNOSIS — Z23 Encounter for immunization: Secondary | ICD-10-CM | POA: Diagnosis not present

## 2022-10-13 DIAGNOSIS — E559 Vitamin D deficiency, unspecified: Secondary | ICD-10-CM | POA: Diagnosis not present

## 2022-10-13 DIAGNOSIS — N184 Chronic kidney disease, stage 4 (severe): Secondary | ICD-10-CM | POA: Diagnosis not present

## 2022-10-27 DIAGNOSIS — D518 Other vitamin B12 deficiency anemias: Secondary | ICD-10-CM | POA: Diagnosis not present

## 2023-01-18 DIAGNOSIS — D518 Other vitamin B12 deficiency anemias: Secondary | ICD-10-CM | POA: Diagnosis not present

## 2023-02-04 DIAGNOSIS — H26493 Other secondary cataract, bilateral: Secondary | ICD-10-CM | POA: Diagnosis not present

## 2023-02-11 DIAGNOSIS — H401132 Primary open-angle glaucoma, bilateral, moderate stage: Secondary | ICD-10-CM | POA: Diagnosis not present

## 2023-03-02 DIAGNOSIS — D518 Other vitamin B12 deficiency anemias: Secondary | ICD-10-CM | POA: Diagnosis not present

## 2023-04-20 DIAGNOSIS — E039 Hypothyroidism, unspecified: Secondary | ICD-10-CM | POA: Diagnosis not present

## 2023-04-20 DIAGNOSIS — I10 Essential (primary) hypertension: Secondary | ICD-10-CM | POA: Diagnosis not present

## 2023-05-05 DIAGNOSIS — D518 Other vitamin B12 deficiency anemias: Secondary | ICD-10-CM | POA: Diagnosis not present

## 2023-05-24 DIAGNOSIS — H401133 Primary open-angle glaucoma, bilateral, severe stage: Secondary | ICD-10-CM | POA: Diagnosis not present

## 2023-05-24 DIAGNOSIS — H01001 Unspecified blepharitis right upper eyelid: Secondary | ICD-10-CM | POA: Diagnosis not present

## 2023-05-24 DIAGNOSIS — H11153 Pinguecula, bilateral: Secondary | ICD-10-CM | POA: Diagnosis not present

## 2023-05-24 DIAGNOSIS — H01002 Unspecified blepharitis right lower eyelid: Secondary | ICD-10-CM | POA: Diagnosis not present

## 2023-07-08 DIAGNOSIS — D518 Other vitamin B12 deficiency anemias: Secondary | ICD-10-CM | POA: Diagnosis not present

## 2023-07-09 ENCOUNTER — Ambulatory Visit (HOSPITAL_COMMUNITY)
Admission: RE | Admit: 2023-07-09 | Discharge: 2023-07-09 | Disposition: A | Discharge status: Home / Self Care | Payer: No Typology Code available for payment source | Source: Ambulatory Visit | Attending: Family Medicine | Admitting: Family Medicine

## 2023-07-09 DIAGNOSIS — M2011 Hallux valgus (acquired), right foot: Secondary | ICD-10-CM | POA: Diagnosis not present

## 2023-07-09 DIAGNOSIS — M79671 Pain in right foot: Secondary | ICD-10-CM | POA: Diagnosis present

## 2023-07-09 DIAGNOSIS — Z681 Body mass index (BMI) 19 or less, adult: Secondary | ICD-10-CM | POA: Diagnosis not present

## 2023-09-01 DIAGNOSIS — D518 Other vitamin B12 deficiency anemias: Secondary | ICD-10-CM | POA: Diagnosis not present

## 2023-09-27 DIAGNOSIS — H01001 Unspecified blepharitis right upper eyelid: Secondary | ICD-10-CM | POA: Diagnosis not present

## 2023-09-27 DIAGNOSIS — H401133 Primary open-angle glaucoma, bilateral, severe stage: Secondary | ICD-10-CM | POA: Diagnosis not present

## 2023-09-27 DIAGNOSIS — H11153 Pinguecula, bilateral: Secondary | ICD-10-CM | POA: Diagnosis not present

## 2023-09-27 DIAGNOSIS — H01002 Unspecified blepharitis right lower eyelid: Secondary | ICD-10-CM | POA: Diagnosis not present

## 2023-10-15 DIAGNOSIS — I10 Essential (primary) hypertension: Secondary | ICD-10-CM | POA: Diagnosis not present

## 2023-10-15 DIAGNOSIS — Z681 Body mass index (BMI) 19 or less, adult: Secondary | ICD-10-CM | POA: Diagnosis not present

## 2023-10-15 DIAGNOSIS — Z0001 Encounter for general adult medical examination with abnormal findings: Secondary | ICD-10-CM | POA: Diagnosis not present

## 2023-10-15 DIAGNOSIS — N184 Chronic kidney disease, stage 4 (severe): Secondary | ICD-10-CM | POA: Diagnosis not present

## 2023-10-15 DIAGNOSIS — D518 Other vitamin B12 deficiency anemias: Secondary | ICD-10-CM | POA: Diagnosis not present

## 2023-10-15 DIAGNOSIS — G629 Polyneuropathy, unspecified: Secondary | ICD-10-CM | POA: Diagnosis not present

## 2023-11-17 DIAGNOSIS — D518 Other vitamin B12 deficiency anemias: Secondary | ICD-10-CM | POA: Diagnosis not present

## 2023-12-29 DIAGNOSIS — D518 Other vitamin B12 deficiency anemias: Secondary | ICD-10-CM | POA: Diagnosis not present

## 2024-02-29 DIAGNOSIS — D518 Other vitamin B12 deficiency anemias: Secondary | ICD-10-CM | POA: Diagnosis not present

## 2024-04-17 DIAGNOSIS — D518 Other vitamin B12 deficiency anemias: Secondary | ICD-10-CM | POA: Diagnosis not present

## 2024-05-24 DIAGNOSIS — N184 Chronic kidney disease, stage 4 (severe): Secondary | ICD-10-CM | POA: Diagnosis not present

## 2024-05-24 DIAGNOSIS — G629 Polyneuropathy, unspecified: Secondary | ICD-10-CM | POA: Diagnosis not present

## 2024-05-24 DIAGNOSIS — R519 Headache, unspecified: Secondary | ICD-10-CM | POA: Diagnosis not present

## 2024-05-24 DIAGNOSIS — Z681 Body mass index (BMI) 19 or less, adult: Secondary | ICD-10-CM | POA: Diagnosis not present

## 2024-05-24 DIAGNOSIS — I10 Essential (primary) hypertension: Secondary | ICD-10-CM | POA: Diagnosis not present

## 2024-05-25 ENCOUNTER — Other Ambulatory Visit (HOSPITAL_COMMUNITY): Payer: Self-pay | Admitting: Internal Medicine

## 2024-05-25 ENCOUNTER — Encounter (HOSPITAL_COMMUNITY): Payer: Self-pay | Admitting: Internal Medicine

## 2024-05-25 DIAGNOSIS — R4182 Altered mental status, unspecified: Secondary | ICD-10-CM

## 2024-05-25 DIAGNOSIS — G459 Transient cerebral ischemic attack, unspecified: Secondary | ICD-10-CM

## 2024-05-25 DIAGNOSIS — R519 Headache, unspecified: Secondary | ICD-10-CM

## 2024-05-29 DIAGNOSIS — D518 Other vitamin B12 deficiency anemias: Secondary | ICD-10-CM | POA: Diagnosis not present

## 2024-06-02 ENCOUNTER — Ambulatory Visit (HOSPITAL_COMMUNITY)
Admission: RE | Admit: 2024-06-02 | Discharge: 2024-06-02 | Disposition: A | Discharge status: Home / Self Care | Source: Ambulatory Visit | Attending: Internal Medicine | Admitting: Internal Medicine

## 2024-06-02 DIAGNOSIS — R519 Headache, unspecified: Secondary | ICD-10-CM | POA: Insufficient documentation

## 2024-06-02 DIAGNOSIS — G459 Transient cerebral ischemic attack, unspecified: Secondary | ICD-10-CM | POA: Diagnosis not present

## 2024-06-02 DIAGNOSIS — R4182 Altered mental status, unspecified: Secondary | ICD-10-CM

## 2024-06-02 DIAGNOSIS — I6502 Occlusion and stenosis of left vertebral artery: Secondary | ICD-10-CM | POA: Diagnosis not present

## 2024-06-02 DIAGNOSIS — I6522 Occlusion and stenosis of left carotid artery: Secondary | ICD-10-CM | POA: Diagnosis not present

## 2024-06-02 MED ORDER — GADOBUTROL 1 MMOL/ML IV SOLN
5.0000 mL | Freq: Once | INTRAVENOUS | Status: AC | PRN
  Administered 2024-06-02: 5 mL via INTRAVENOUS

## 2024-08-11 DIAGNOSIS — D518 Other vitamin B12 deficiency anemias: Secondary | ICD-10-CM | POA: Diagnosis not present

## 2024-09-11 ENCOUNTER — Telehealth: Payer: Self-pay

## 2024-09-11 NOTE — Telephone Encounter (Signed)
 Copied from CRM #[id number]. Topic: Appointments - Scheduling Inquiry for Clinic >> Sep 11, 2024  2:52 PM Jasmin G wrote: Reason for CRM: Pt called schedule a new pt appt on Jan 23rd, but states that she needs to get a b12 shot before that date, call pt back at [phone] to let her know if this is possible.

## 2024-09-12 NOTE — Telephone Encounter (Signed)
 Added to wait list.

## 2024-10-25 ENCOUNTER — Encounter (HOSPITAL_COMMUNITY): Payer: Self-pay

## 2024-10-25 ENCOUNTER — Emergency Department (HOSPITAL_COMMUNITY)
Admission: EM | Admit: 2024-10-25 | Discharge: 2024-10-25 | Disposition: A | Discharge status: Home / Self Care | Attending: Emergency Medicine | Admitting: Emergency Medicine

## 2024-10-25 DIAGNOSIS — Z79890 Hormone replacement therapy (postmenopausal): Secondary | ICD-10-CM | POA: Diagnosis not present

## 2024-10-25 DIAGNOSIS — Z760 Encounter for issue of repeat prescription: Secondary | ICD-10-CM | POA: Diagnosis present

## 2024-10-25 HISTORY — DX: Disorder of thyroid, unspecified: E07.9

## 2024-10-25 HISTORY — DX: Essential (primary) hypertension: I10

## 2024-10-25 MED ORDER — TRIAMTERENE-HCTZ 37.5-25 MG PO TABS: 1.0000 | ORAL_TABLET | Freq: Every day | ORAL | 0 refills | Status: DC

## 2024-10-25 MED ORDER — LEVOTHYROXINE SODIUM 25 MCG PO TABS: 25.0000 ug | ORAL_TABLET | Freq: Every day | ORAL | 0 refills | Status: DC

## 2024-10-25 MED ORDER — SODIUM CHLORIDE 1 G PO TABS: 1.0000 g | ORAL_TABLET | ORAL | 0 refills | Status: AC

## 2024-10-25 NOTE — ED Provider Notes (Signed)
 [place] EMERGENCY DEPARTMENT AT [facility] Provider Note   CSN: [id number] Arrival date & time: 10/25/24  1432     Patient presents with: Medication Refill   Brandi Blackburn is a 89 y.o. female.    Medication Refill      Brandi Blackburn is a 89 y.o. female who presents to the Emergency Department requesting medication refill.  She states that she takes 1 sodium tablet per week and takes levothyroxine daily 25 mcg and triamterene/HCTZ 37.5-25 mg tablets daily.  She states her primary care provider recently moved out of the area and she does not currently have a PCP.  She is call to make an appointment with someone but cannot be seen until January.  She called her pharmacy and was recommended to come to the ER for possible refills of her medications.  She denies any symptoms.  Prior to Admission medications   Medication Sig Start Date End Date Taking? Authorizing Provider  levothyroxine (SYNTHROID) 25 MCG tablet Take 25 mcg by mouth daily before breakfast.   Yes [provider]  triamterene-hydrochlorothiazide (DYAZIDE) 37.5-25 MG capsule Take 1 capsule by mouth daily.   Yes [provider]    Allergies: Penicillins    Review of Systems  All other systems reviewed and are negative.   Updated Vital Signs BP (!) 127/59 (BP Location: Right Arm)   Pulse 73   Temp 98.4 F (36.9 C) (Oral)   Resp 16   Ht 5' 3 (1.6 m)   Wt 43.1 kg   LMP  (Exact Date)   SpO2 100%   BMI 16.83 kg/m   Physical Exam Vitals and nursing note reviewed.  Constitutional:      General: She is not in acute distress.    Appearance: Normal appearance. She is not ill-appearing or toxic-appearing.  HENT:     Mouth/Throat:     Mouth: Mucous membranes are moist.  Cardiovascular:     Rate and Rhythm: Normal rate and regular rhythm.     Pulses: Normal pulses.  Pulmonary:     Effort: Pulmonary effort is normal.  Musculoskeletal:        General: Normal range  of motion.     Right lower leg: No edema.     Left lower leg: No edema.  Skin:    General: Skin is warm.  Neurological:     Mental Status: She is alert.     (all labs ordered are listed, but only abnormal results are displayed) Labs Reviewed  I-STAT CHEM 8, ED - Abnormal; Notable for the following components:      Result Value   BUN 35 (*)    Creatinine, Ser 1.70 (*)    Hemoglobin 10.2 (*)    HCT 30.0 (*)    All other components within normal limits    EKG: None  Radiology: No results found.   Procedures   Medications Ordered in the ED - No data to display                                  Medical Decision Making   Patient here from home requesting refill of her routine medications.  She states her PCP recently retired and she has tried to establish primary care but cannot be seen until January.  She ran out of her antihypertensive medication and levothyroxine.  Patient also states that she takes a sodium tablet once a  week, but I cannot find this in her medical record.  Last dose was 2 days ago.  She denies any symptoms.  She contacted her pharmacy and was advised to come here for possible medication refills.  Will check point-of-care for her sodium level.  Amount and/or Complexity of Data Reviewed Labs: ordered.    Details: Sodium 141 today, some renal insufficiency, no recents available for comparison Discussion of management or test interpretation with external provider(s):  Patient requesting discharge home stating that she does not drive after dark and she drove herself here to the emergency department.  She is well-appearing.  Her vital signs are reassuring.  Mentating well.  No hyponatremia.  Point-of-care shows some renal insufficiency.  I spoke with pharmacist at [facility].  Patient takes alendronate sodium once a week, not oral sodium.  Oral sodium prescription was canceled.  Patient appears appropriate for discharge home, she was given local  primary care that she may contact to establish care.  return precautions given   Risk OTC drugs. Prescription drug management.        Final diagnoses:  Encounter for medication refill    ED Discharge Orders     None          Herlinda Milling, DEVONNA 10/25/24 1622    Yolande Lamar BROCKS, MD 10/31/24 2320

## 2024-10-26 ENCOUNTER — Encounter (HOSPITAL_COMMUNITY): Payer: Self-pay | Admitting: Ophthalmology

## 2024-11-29 ENCOUNTER — Emergency Department (HOSPITAL_COMMUNITY)
Admission: EM | Admit: 2024-11-29 | Discharge: 2024-11-29 | Disposition: A | Discharge status: Home / Self Care | Attending: Emergency Medicine | Admitting: Emergency Medicine

## 2024-11-29 ENCOUNTER — Other Ambulatory Visit: Payer: Self-pay

## 2024-11-29 ENCOUNTER — Encounter (HOSPITAL_COMMUNITY): Payer: Self-pay

## 2024-11-29 DIAGNOSIS — Z760 Encounter for issue of repeat prescription: Secondary | ICD-10-CM | POA: Diagnosis present

## 2024-11-29 LAB — I-STAT CHEM 8, ED
BUN: 23 mg/dL (ref 8–23)
Calcium, Ion: 1.11 mmol/L — ABNORMAL LOW (ref 1.15–1.40)
Chloride: 101 mmol/L (ref 98–111)
Creatinine, Ser: 1.4 mg/dL — ABNORMAL HIGH (ref 0.44–1.00)
Glucose, Bld: 104 mg/dL — ABNORMAL HIGH (ref 70–99)
HCT: 32 % — ABNORMAL LOW (ref 36.0–46.0)
Hemoglobin: 10.9 g/dL — ABNORMAL LOW (ref 12.0–15.0)
Potassium: 4.1 mmol/L (ref 3.5–5.1)
Sodium: 140 mmol/L (ref 135–145)
TCO2: 28 mmol/L (ref 22–32)

## 2024-11-29 MED ORDER — LEVOTHYROXINE SODIUM 25 MCG PO TABS: 25.0000 ug | ORAL_TABLET | Freq: Every day | ORAL | 0 refills | Status: DC

## 2024-11-29 MED ORDER — TRIAMTERENE-HCTZ 37.5-25 MG PO CAPS: 1.0000 | ORAL_CAPSULE | Freq: Every day | ORAL | 0 refills | Status: DC

## 2024-11-29 NOTE — Discharge Instructions (Signed)
 Your medications have been refilled as a courtesy for you.  Please keep your upcoming appointment with your new primary care provider for ongoing medication refills.  Your sodium today was in the normal range.

## 2024-11-29 NOTE — ED Triage Notes (Signed)
 Pt arrived via POV c/o needing a medication refill of her BP medication, sodium medication and thyroid  medication. Pt presents in NAD. Pt reports it has been a couple of days since she has had her medications.

## 2024-11-29 NOTE — ED Provider Notes (Signed)
 [name] AT [facility] Provider Note   CSN: [id number] Arrival date & time: 11/29/24  1344     Patient presents with: Medication Refill   Brandi Blackburn is a 89 y.o. female.    Medication Refill      Brandi Blackburn is a 89 y.o. female who presents to the Emergency Department requesting refills for her thyroid  medication and her antihypertensive medication.  She denies any symptoms at this time.  She states that she ran out of her medication 2 days ago.  She is awaiting appointment in January with a new PCP.  She states that the local PCP clinic closed and therefore she has not had a primary care provider to follow-up with.  She is requesting enough refills of her medication to last until her appointment.  She also states that she takes a salt pill intermittently although I do not find this on her medical record  Prior to Admission medications   Medication Sig Start Date End Date Taking? Authorizing Provider  alendronate (FOSAMAX) 70 MG tablet Take 70 mg by mouth every Saturday. Take with a full glass of water  on an empty stomach.    [provider]  Cholecalciferol (VITAMIN D3 SUPER STRENGTH) 50 MCG (2000 UT) CAPS Take 2,000 Units by mouth daily.    [provider]  levothyroxine  (SYNTHROID ) 25 MCG tablet Take 25 mcg by mouth daily before breakfast.    [provider]  levothyroxine  (SYNTHROID ) 25 MCG tablet Take 1 tablet (25 mcg total) by mouth daily before breakfast. 10/25/24   Medha Pippen, PA-C  levothyroxine  (SYNTHROID , LEVOTHROID) 25 MCG tablet Take 25 mcg by mouth daily before breakfast.    [provider]  sodium chloride 1 g tablet Take 1 tablet (1 g total) by mouth once a week. 10/25/24   Baley Lorimer, PA-C  triamterene -hydrochlorothiazide (DYAZIDE) 37.5-25 MG capsule Take 1 capsule by mouth daily.    [provider]  triamterene -hydrochlorothiazide (MAXZIDE-25) 37.5-25 MG per tablet  Take 1 tablet by mouth daily.    [provider]  triamterene -hydrochlorothiazide (MAXZIDE-25) 37.5-25 MG tablet Take 1 tablet by mouth daily. 10/25/24   Jin Shockley, PA-C    Allergies: Penicillins and Penicillins    Review of Systems  All other systems reviewed and are negative.   Updated Vital Signs BP (!) 149/64 (BP Location: Right Arm)   Pulse 64   Temp 97.7 F (36.5 C) (Oral)   Resp 16   Ht 5' 3 (1.6 m)   Wt 43.1 kg   SpO2 99%   BMI 16.83 kg/m   Physical Exam Vitals and nursing note reviewed.  Constitutional:      General: She is not in acute distress.    Appearance: Normal appearance. She is not toxic-appearing.  Eyes:     Conjunctiva/sclera: Conjunctivae normal.  Cardiovascular:     Rate and Rhythm: Normal rate and regular rhythm.     Pulses: Normal pulses.  Pulmonary:     Effort: Pulmonary effort is normal.     Breath sounds: Normal breath sounds.  Abdominal:     Palpations: Abdomen is soft.     Tenderness: There is no abdominal tenderness.  Musculoskeletal:        General: Normal range of motion.     Cervical back: Normal range of motion.     Right lower leg: No edema.     Left lower leg: No edema.  Lymphadenopathy:     Cervical: No cervical adenopathy.  Skin:    General: Skin is warm.     Capillary Refill: Capillary refill takes less than 2 seconds.  Neurological:     General: No focal deficit present.     Mental Status: She is alert.     Sensory: No sensory deficit.     Motor: No weakness.     (all labs ordered are listed, but only abnormal results are displayed) Labs Reviewed  I-STAT CHEM 8, ED - Abnormal; Notable for the following components:      Result Value   Creatinine, Ser 1.40 (*)    Glucose, Bld 104 (*)    Calcium, Ion 1.11 (*)    Hemoglobin 10.9 (*)    HCT 32.0 (*)    All other components within normal limits    EKG: None  Radiology: No results found.   Procedures   Medications Ordered in the ED - No data  to display                                  Medical Decision Making   Patient here for refills of her Synthroid  and her Dyazide.  States she ran out of her medications 2 days ago.  She has an appointment with a new PCP in January and is here requesting refills until her new appointment.  She denies any symptoms at this time.  She was also seen here last month for refills of her medications.   Patient stated to me that she also takes a sodium tablet several times a week.  I have checked her sodium here and it is in within normal limits.  I will have her defer to her new PCP for further management of this.     Amount and/or Complexity of Data Reviewed Labs: ordered.    Details: Sodium unremarkable.  Serum creatinine elevated but near baseline.  Hemoglobin slightly low but also near baseline Discussion of management or test interpretation with external provider(s):   Patient asymptomatic.  Refills for her antihypertensive and thyroid  medication sent to her pharmacy of record.  Patient to keep her upcoming appointment with her new PCP.  Risk Prescription drug management.        Final diagnoses:  Encounter for medication refill    ED Discharge Orders     None          Herlinda Milling, DEVONNA 12/01/24 1429    Cleotilde Rogue, MD 12/03/24 2219

## 2025-01-01 ENCOUNTER — Telehealth: Payer: Self-pay

## 2025-01-01 NOTE — Telephone Encounter (Signed)
 Daughter advised that provider is out of office on the 23rd, and soonest she can get in to be seen is 1/30. Patient scheduled

## 2025-01-01 NOTE — Telephone Encounter (Signed)
 Copied from CRM #[id number]. Topic: Appointments - Scheduling Inquiry for Clinic >> Jan 01, 2025 12:55 PM Jasmin G wrote: Reason for CRM: Pt's daughter, Ms. Shona requested a call back at [phone] to discuss the possibility of getting in sooner than Jan 23rd or coming to an arrangement to refill pt's meds, as she states that she has been having to go to the ER to refill meds every month.

## 2025-01-02 ENCOUNTER — Emergency Department (HOSPITAL_COMMUNITY)
Admission: EM | Admit: 2025-01-02 | Discharge: 2025-01-02 | Disposition: A | Discharge status: Home / Self Care | Attending: Emergency Medicine | Admitting: Emergency Medicine

## 2025-01-02 ENCOUNTER — Other Ambulatory Visit: Payer: Self-pay

## 2025-01-02 ENCOUNTER — Encounter (HOSPITAL_COMMUNITY): Payer: Self-pay

## 2025-01-02 ENCOUNTER — Telehealth: Payer: Self-pay

## 2025-01-02 DIAGNOSIS — Z760 Encounter for issue of repeat prescription: Secondary | ICD-10-CM | POA: Insufficient documentation

## 2025-01-02 MED ORDER — LEVOTHYROXINE SODIUM 25 MCG PO TABS: 25.0000 ug | ORAL_TABLET | Freq: Every day | ORAL | 2 refills | Status: AC

## 2025-01-02 MED ORDER — TRIAMTERENE-HCTZ 37.5-25 MG PO CAPS: 1.0000 | ORAL_CAPSULE | Freq: Every day | ORAL | 2 refills | Status: AC

## 2025-01-02 NOTE — Discharge Instructions (Signed)
 Follow-up with your new primary care on the 31st.  Take medications as prescribed.  If you have any concerns please return to the ER.

## 2025-01-02 NOTE — ED Provider Notes (Signed)
 " [place] EMERGENCY DEPARTMENT AT [facility] Provider Note   CSN: [id number] Arrival date & time: 01/02/25  1414     Patient presents with: Medication Refill   Brandi Blackburn is a 89 y.o. female.    Medication Refill 89 year old female presenting with need for medication refill.  Patient's former primary care retired and she is currently in the process of getting a new one.  She has an appointment scheduled for January 30 however ran out of her medications before she would be able to get to this appointment.  Patient has no complaints at this time.     Prior to Admission medications  Medication Sig Start Date End Date Taking? Authorizing Provider  alendronate (FOSAMAX) 70 MG tablet Take 70 mg by mouth every Saturday. Take with a full glass of water  on an empty stomach.    [provider]  Cholecalciferol (VITAMIN D3 SUPER STRENGTH) 50 MCG (2000 UT) CAPS Take 2,000 Units by mouth daily.    [provider]  levothyroxine  (SYNTHROID ) 25 MCG tablet Take 1 tablet (25 mcg total) by mouth daily before breakfast. 11/29/24   Triplett, Tammy, PA-C  levothyroxine  (SYNTHROID ) 25 MCG tablet Take 25 mcg by mouth daily before breakfast.    [provider]  sodium chloride  1 g tablet Take 1 tablet (1 g total) by mouth once a week. 10/25/24   Triplett, Tammy, PA-C  triamterene -hydrochlorothiazide (DYAZIDE) 37.5-25 MG capsule Take 1 each (1 capsule total) by mouth daily. 11/29/24   Triplett, Tammy, PA-C    Allergies: Penicillins and Penicillins    Review of Systems  All other systems reviewed and are negative.   Updated Vital Signs BP (!) 170/69 (BP Location: Right Arm)   Pulse 67   Temp 98.2 F (36.8 C) (Oral)   Resp 18   Ht 5' 3 (1.6 m)   Wt 43.1 kg   SpO2 100%   BMI 16.83 kg/m   Physical Exam Vitals and nursing note reviewed.  HENT:     Mouth/Throat:     Pharynx: Oropharynx is clear.  Cardiovascular:     Rate and Rhythm: Normal rate.      Pulses: Normal pulses.     Heart sounds: Normal heart sounds.  Pulmonary:     Effort: Pulmonary effort is normal.     Breath sounds: Normal breath sounds.  Skin:    General: Skin is warm and dry.  Neurological:     General: No focal deficit present.     Mental Status: She is alert.     (all labs ordered are listed, but only abnormal results are displayed) Labs Reviewed - No data to display  EKG: None  Radiology: No results found.   Procedures   Medications Ordered in the ED - No data to display                                  Medical Decision Making Risk Prescription drug management.  Impression: 89 year old female presenting for medication refill.  Additional History: Patient was able to provide history.  I also reviewed other outpatient notes.  Labs: None  Imaging: None  ED Course/Meds: 89 year old female presenting with a need for medication refills.  Patient was well-appearing and in no acute distress.  Patient had no complaints today other than she needed her medications refilled.  She has not taken them in 2 days.  She denies any adverse symptoms  to this.  She reports that she had a primary care doctor that retired and has been trying to get in with a new one.  The first available appointment that they had was January 30.  She is scheduled with a primary care doctor for that day.  We refilled her medications for today and told her to follow-up with her primary care for some refills.  He was educated on signs and symptoms of when to return to the ER.  Patient was stable while in the ER and at discharge.      Final diagnoses:  None    ED Discharge Orders     None          [name] 01/02/25 1531    Melvenia Motto, MD 01/02/25 1620  "

## 2025-01-02 NOTE — ED Triage Notes (Signed)
 Pt arrived via POV c/o needing her three medications refilled. Pt reports she does not have her doctors appointment until the end of this month and needs a refill to make it until then.

## 2025-01-02 NOTE — Telephone Encounter (Signed)
 Spoke with patient and advised Refill unable to be provided until the patient has been seen by the provider  Doesn't want to wait anymore in the ED.  Doesn't like [facility]  [address] so states she cannot drive to [place] to be seen by [facility] [employer].   Patient frustrated as she is out of her BP meds and her thyroid  meds.  Call ended by patient

## 2025-01-02 NOTE — Telephone Encounter (Signed)
 Copied from CRM [id number]. Topic: Clinical - Medication Refill >> Jan 02, 2025 12:42 PM [name] wrote: Medication: triamterene -hydrochlorothiazide (DYAZIDE) 37.5-25 MG capsule levothyroxine  (SYNTHROID ) 25 MCG tablet  [facility] patient appnt on 1-30 at [facility]  needs enough until appnt   Has the patient contacted their pharmacy? Yes (Agent: If no, request that the patient contact the pharmacy for the refill. If patient does not wish to contact the pharmacy document the reason why and proceed with request.) (Agent: If yes, when and what did the pharmacy advise?)  This is the patient's preferred pharmacy:  [facility] - [place], [place] - [address] [place] [address] Phone: [phone] Fax: [phone]  Is this the correct pharmacy for this prescription? Yes If no, delete pharmacy and type the correct one.   Has the prescription been filled recently? No  Is the patient out of the medication? Yes  Has the patient been seen for an appointment in the last year OR does the patient have an upcoming appointment? Yes  Can we respond through MyChart? Yes  Agent: Please be advised that Rx refills may take up to 3 business days. We ask that you follow-up with your pharmacy.

## 2025-01-02 NOTE — ED Notes (Signed)
 Pt reports she ran out of her medications 2 days ago.

## 2025-01-19 ENCOUNTER — Ambulatory Visit: Payer: Self-pay

## 2025-02-06 ENCOUNTER — Ambulatory Visit: Payer: Self-pay
